# Patient Record
Sex: Male | Born: 1945 | Race: White | Hispanic: No | Marital: Married | Smoking: Former smoker
Health system: Southern US, Community
[De-identification: ages and names within clinical notes are randomized; demographics above are authoritative.]

## PROBLEM LIST (undated history)

## (undated) DIAGNOSIS — I509 Heart failure, unspecified: Secondary | ICD-10-CM

## (undated) DIAGNOSIS — M1990 Unspecified osteoarthritis, unspecified site: Secondary | ICD-10-CM

## (undated) DIAGNOSIS — R011 Cardiac murmur, unspecified: Secondary | ICD-10-CM

## (undated) DIAGNOSIS — J449 Chronic obstructive pulmonary disease, unspecified: Secondary | ICD-10-CM

## (undated) DIAGNOSIS — S82409A Unspecified fracture of shaft of unspecified fibula, initial encounter for closed fracture: Secondary | ICD-10-CM

## (undated) DIAGNOSIS — H269 Unspecified cataract: Secondary | ICD-10-CM

## (undated) DIAGNOSIS — I10 Essential (primary) hypertension: Secondary | ICD-10-CM

## (undated) DIAGNOSIS — R0902 Hypoxemia: Secondary | ICD-10-CM

## (undated) DIAGNOSIS — G4730 Sleep apnea, unspecified: Secondary | ICD-10-CM

## (undated) HISTORY — DX: Unspecified osteoarthritis, unspecified site: M19.90

## (undated) HISTORY — PX: EYE SURGERY: SHX253

## (undated) HISTORY — PX: OTHER SURGICAL HISTORY: SHX169

## (undated) HISTORY — DX: Essential (primary) hypertension: I10

## (undated) HISTORY — PX: BACK SURGERY: SHX140

## (undated) HISTORY — DX: Cardiac murmur, unspecified: R01.1

## (undated) HISTORY — DX: Heart failure, unspecified: I50.9

## (undated) HISTORY — DX: Unspecified fracture of shaft of unspecified fibula, initial encounter for closed fracture: S82.409A

## (undated) HISTORY — DX: Unspecified cataract: H26.9

## (undated) HISTORY — DX: Sleep apnea, unspecified: G47.30

## (undated) HISTORY — DX: Hypoxemia: R09.02

## (undated) HISTORY — DX: Chronic obstructive pulmonary disease, unspecified: J44.9

---

## 2005-12-24 ENCOUNTER — Ambulatory Visit (HOSPITAL_COMMUNITY): Admission: RE | Admit: 2005-12-24 | Discharge: 2005-12-25 | Payer: Self-pay | Admitting: Neurosurgery

## 2007-05-26 IMAGING — CR DG CHEST 2V
2 series · 2 of 2 positions shown · non-contrast
Comparison: None.

CLINICAL DATA: Pre admit for HNP.  
 CHEST ? 2 VIEW:

[view not recorded (1 of 2)]
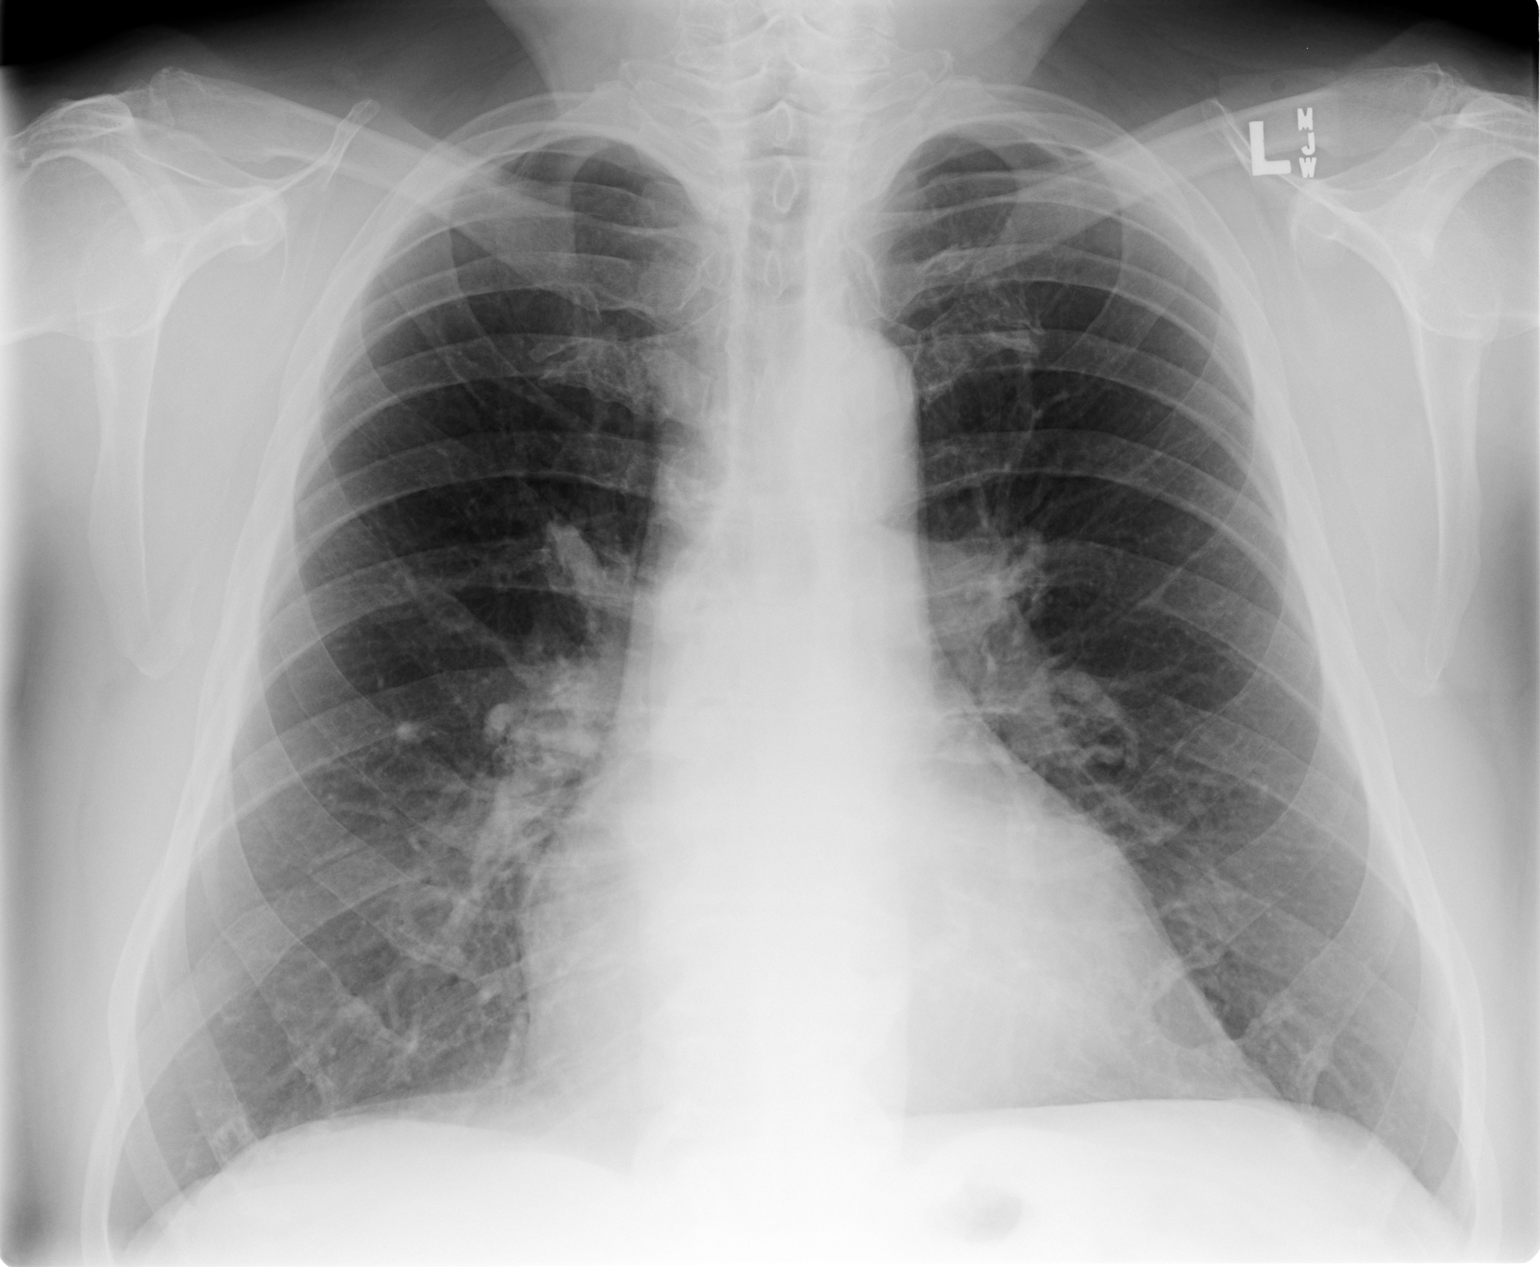

[view not recorded (2 of 2)]
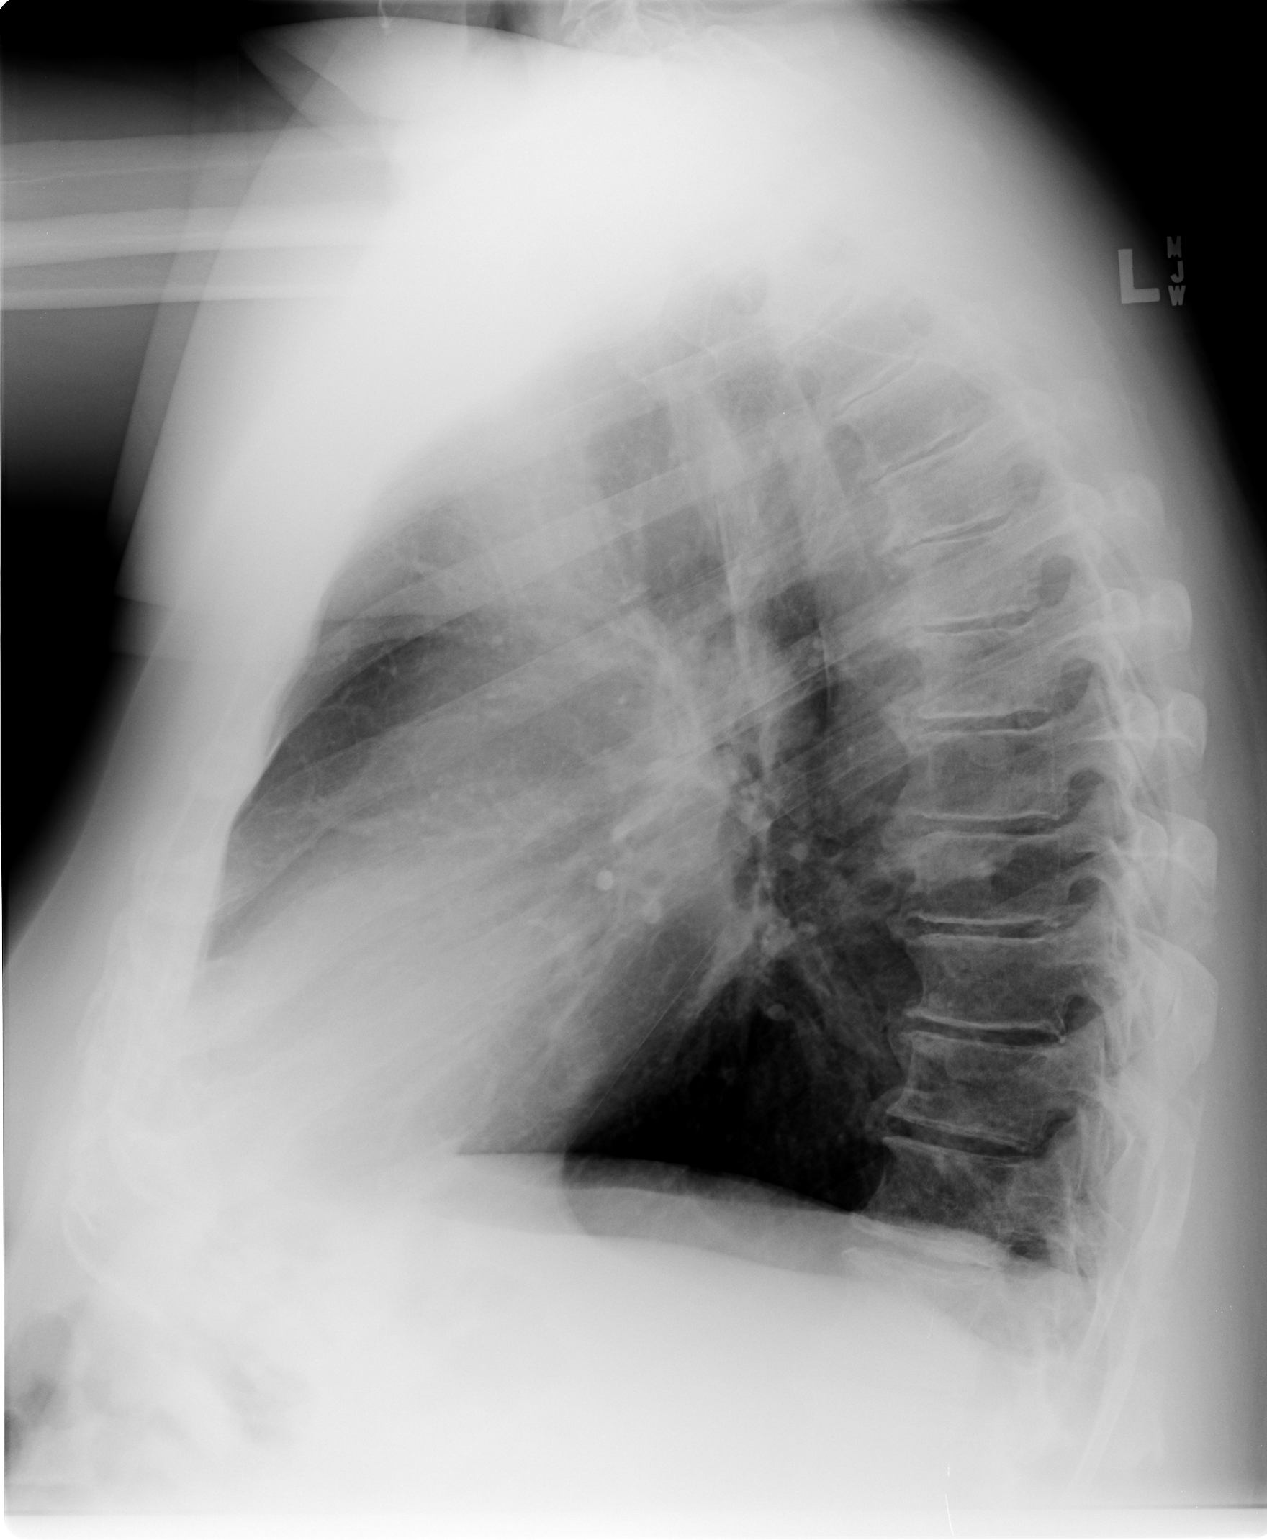

[2 of 2 positions shown; findings below may reference images not displayed]

FINDINGS: Heart size is upper normal.  No congestive heart failure or active disease.  There is either a calcified granuloma, or vessel on end simulating a granuloma, in the right midlung in the PA film.  
 The lungs are somewhat hyperaerated with peribronchial thickening.  Degenerative changes of the thoracic spine.
IMPRESSION: Chronic changes as described above ? no active disease.

## 2007-05-28 IMAGING — CR DG LUMBAR SPINE 1V
1 series · 1 of 1 positions shown · non-contrast
Comparison: None available.

CLINICAL DATA: L3-4 diskectomy.  
 LUMBAR SPINE ? 1 VIEW:

[view not recorded]
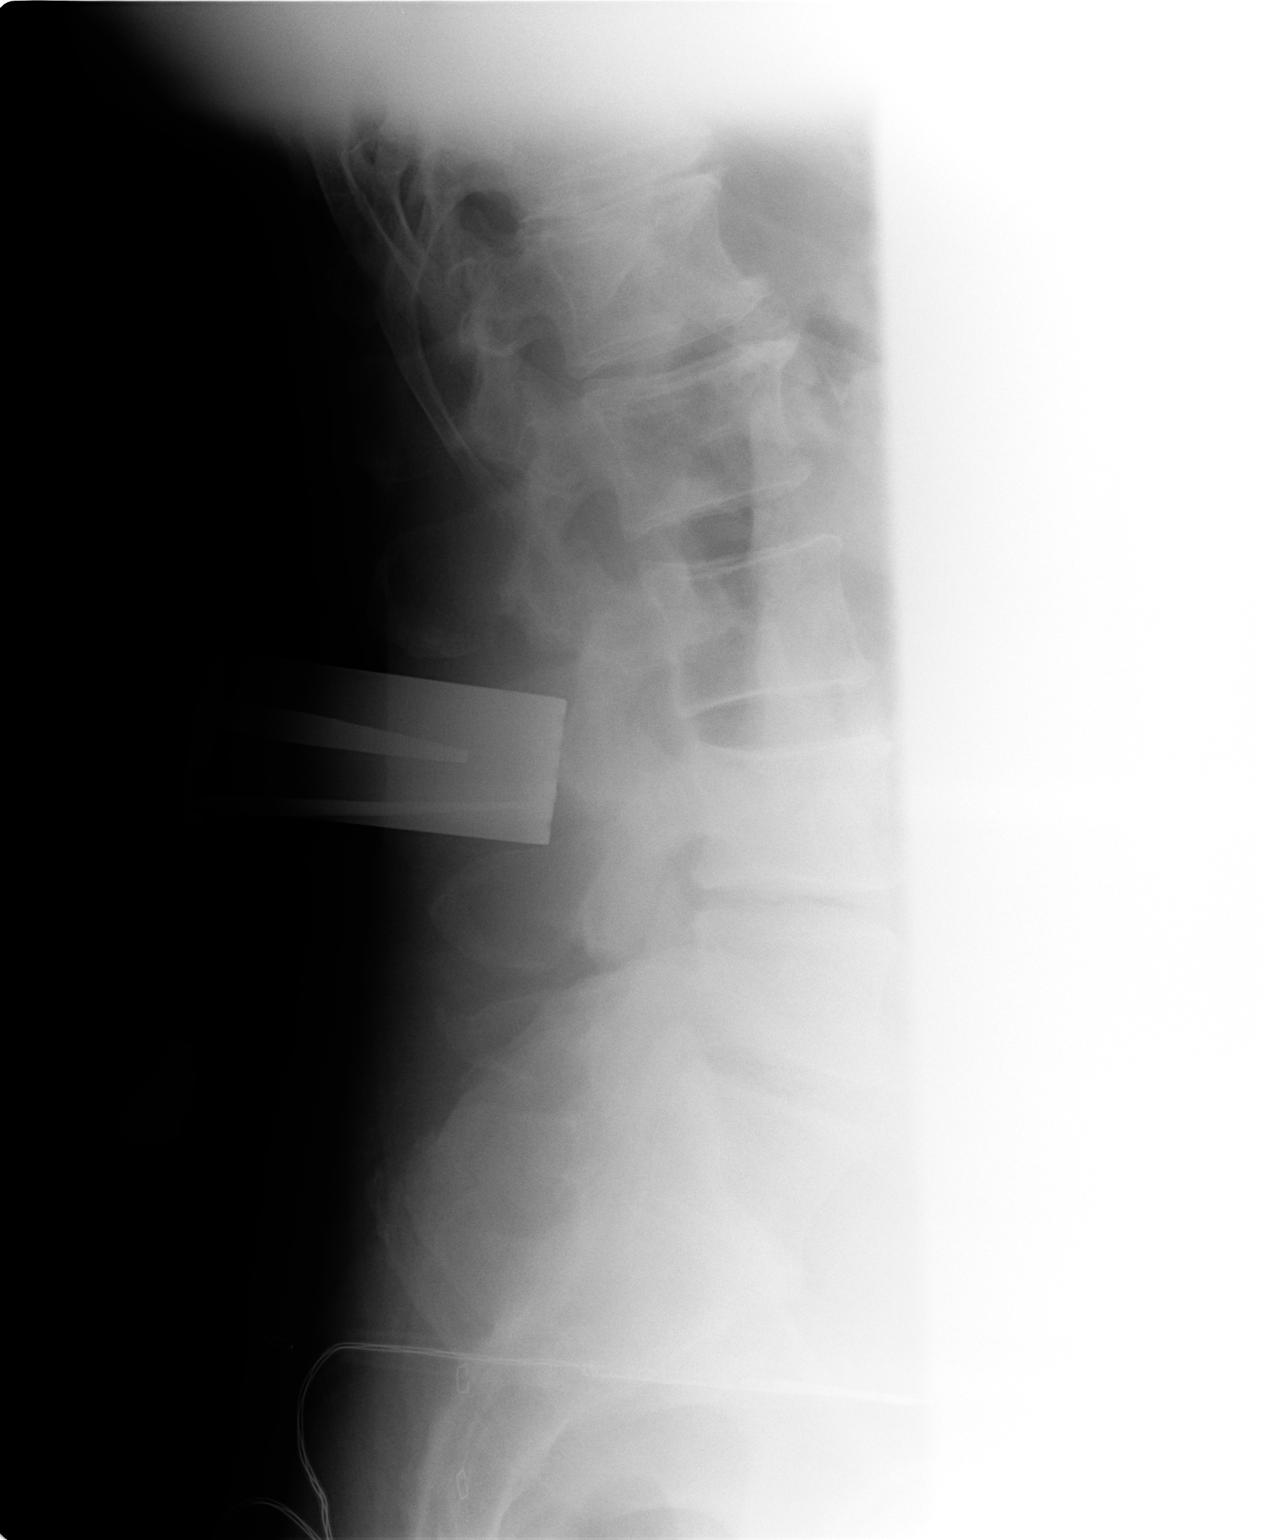

[1 of 1 positions shown; findings below may reference images not displayed]

FINDINGS: Single image labeled number 1 at 8278 hours.  There is diminutive intravertebral disc space.  This suggest a transitional lumbosacral vertebral body.  Presuming this transitional vertebral body is labeled L5, a surgical device projects posterior to L3.  Vertebral bodies are of normal height.
IMPRESSION: There is likely transitional anatomy.  The surgical device projects posterior to the L3 (presuming the transitional vertebral body is labeled L5).  Findings were called and discussed with Dr. Banyana in the operating room on the morning of 12/24/05 at approximately 5522 hours.

## 2015-01-10 NOTE — Patient Outreach (Signed)
[facility] Customer service manager [facility]) Care Management  01/10/2015  Christian Grimes May 06, 1946 [id number]   Referral from High Risk List, assigned Christian Pavy, RN to outreach.  [name]. [name] [name] Care Management [facility] CM Assistant Phone: [phone] Fax: [phone]

## 2015-01-16 ENCOUNTER — Other Ambulatory Visit: Payer: Self-pay

## 2015-01-16 NOTE — Patient Outreach (Signed)
High Risk Screening call: Reviewed noted from  MD office. Placed call to patient.  No answer. Unidentified machine. No message left.   PLAN:  Will continue to outreach patient and offer services.  Rowe PavyAmanda Idelle Reimann, RN, BSN, CEN [facility] [employer] [employer] Coordinator [phone]

## 2015-01-21 ENCOUNTER — Other Ambulatory Visit: Payer: Self-pay

## 2015-01-21 NOTE — Patient Outreach (Signed)
2nd outreach attempt for risk screening:  Placed call to patient and patient identified himself. I explained purpose of call and described program. [facility]  Offer initial home visit on July 18th and patient has accepted. Confirmed address.  Rowe PavyAmanda Kristofor Michalowski, RN, BSN, CEN [facility] Care Coordinator [phone]

## 2015-02-03 ENCOUNTER — Other Ambulatory Visit: Payer: Self-pay

## 2015-02-03 VITALS — BP 110/60 | HR 83 | Resp 18 | Ht 70.0 in | Wt 274.0 lb

## 2015-02-03 DIAGNOSIS — I509 Heart failure, unspecified: Secondary | ICD-10-CM

## 2015-02-03 NOTE — Patient Outreach (Signed)
[facility]) Care Management   02/03/2015  Christian Grimes [birth year] [id number]  Christian Grimes is an 69 y.o. male Initial home visit at 10 am. Called patient 5 minutes prior to arrival to notify patient I was close to his home per his request to be able to put up the dogs. Subjective:  Arrived to find patient sitting in den area.  Wearing oxygen. Patient reports that he had to go to the emergency department 4 days ago because of shortness of breath. States that his air conditioner was not working. Air conditioner was repaired. Patient reports that he is doing the best that he can. States that his wife died about 8-9 months ago. Patient has hired someone to come clean his house 2 times per month. Hired someone to do the yard work.  Reports that he is still able to drive.  Patient reports that he uses his scooter when her goes to store. Patient reports that he uses [employer] for frozen meals. States that he eats cereal or oatmeal for breakfast and then eats a frozen meal for dinner. Patient reports that he is on the waiting list of meals on wheels.   Patient reports that he has a home [facility] that he uses for daily weights and blood pressures. Patient uses [facility] for nebulizers.  Uses [employer] For oxygen use.  They service and fill his portable oxygen canisters.   Patient reports that his COPD and Heart Failure is bad.  He has recently had a fall at home in the carport.   Objective:  Filed Vitals:   02/03/15 1033  BP: 110/60  Pulse: 83  Resp: 18  Height: 1.778 m (5\' 10" )  Weight: 274 lb (124.286 kg)  SpO2: 98%    Review of Systems  Respiratory: Positive for cough and shortness of breath.   Cardiovascular: Positive for leg swelling.  Skin: Positive for itching.       Reports his legs swell and then itch. He scratches and then has wounds on his legs.    Physical Exam  Vitals reviewed. Constitutional: He is oriented to person,  place, and time. He appears well-developed and well-nourished.  Cardiovascular: Normal rate.   Irregular rhythm  Respiratory: Effort normal and breath sounds normal.  Respirations even and unlabored when at rest. Appears short of breath with any activity like walking in the home.  GI: Soft. Bowel sounds are normal.  Musculoskeletal: He exhibits edema.  Right legs 4 plus edema up to the right knee, Left leg 1 plus edema at the knee. Has compression wrap to the left leg. Unable to visualize any edema to the lower leg due to compression wrap.  Neurological: He is alert and oriented to person, place, and time.  Skin: Skin is warm and dry.  Right lower leg with scaly appearance.  Right lateral aspect of the lower leg with 2 open areas noted. Dried blood on lower leg.  One open area to the back of the right leg.  Right foot with skin intact.   Unable to visualize the left leg because of compression wrap. Knee and thigh without areas of breakdown.  Psychiatric: He has a normal mood and affect. His behavior is normal. Judgment and thought content normal.    Current Medications:   Current Outpatient Prescriptions  Medication Sig Dispense Refill  . acetaminophen (TYLENOL) 500 MG tablet Take 500 mg by mouth every 6 (six) hours as needed.    .Marland Kitchen  albuterol (ACCUNEB) 1.25 MG/3ML nebulizer solution Take 1 ampule by nebulization every 6 (six) hours as needed.    Marland Kitchen albuterol-ipratropium (COMBIVENT) 18-103 MCG/ACT inhaler Inhale 2 puffs into the lungs every 4 (four) hours. Patient has been taking this need as needed only.    Marland Kitchen atorvastatin (LIPITOR) 20 MG tablet Take 20 mg by mouth daily.    . carvedilol (COREG) 6.25 MG tablet Take 6.25 mg by mouth 2 (two) times daily with a meal.    . furosemide (LASIX) 40 MG tablet Take 160 mg by mouth 2 (two) times daily. Patient take 4 tablets 40 mg each    . ipratropium (ATROVENT) 0.02 % nebulizer solution Take 0.5 mg by nebulization 4 (four) times daily. Patient reports  that he takes his med as needed only    . lisinopril (PRINIVIL,ZESTRIL) 5 MG tablet Take 5 mg by mouth daily.    Marland Kitchen loperamide (IMODIUM) 2 MG capsule Take 1 capsule by mouth as needed.    . metolazone (ZAROXOLYN) 5 MG tablet Take 5 mg by mouth as needed. Patient reports that he takes this med once or twice a week depending on weight gain.    . Tiotropium Bromide-Olodaterol 2.5-2.5 MCG/ACT AERS Inhale 2 puffs into the lungs 1 day or 1 dose. Patient was taking as needed only    . warfarin (COUMADIN) 5 MG tablet Take 5 mg by mouth daily. Patient has his own INR machine and checks his own labs.     No current facility-administered medications for this visit.    Functional Status:   In your present state of health, do you have any difficulty performing the following activities: 02/03/2015  Hearing? N  Vision? N  Difficulty concentrating or making decisions? N  Walking or climbing stairs? Y  Dressing or bathing? N  Doing errands, shopping? N  Preparing Food and eating ? Y  Using the Toilet? N  In the past six months, have you accidently leaked urine? Y  Do you have problems with loss of bowel control? N  Managing your Medications? N  Managing your Finances? N  Housekeeping or managing your Housekeeping? Y    Fall/Depression Screening:    PHQ 2/9 Scores 02/03/2015  PHQ - 2 Score 0   Fall Risk  02/03/2015  Falls in the past year? Yes  Number falls in past yr: 2 or more  Injury with Fall? No  Risk Factor Category  High Fall Risk  Risk for fall due to : History of fall(s)  Follow up Falls prevention discussed   Assessment:  (1) New patient. Reviewed [facility] program. Provided and reviewed welcome packet. Provided contact information and [facility] magnet. (2) increased risk for falls. (3) no current advance directives/ observe advanced directives wife was list as health care power of attorney. ( wife is deceased) (4)lack of understanding about medications. (5) lack of understanding about management  of heart failure and COPD. (6) War Psychologist, counselling.  Could use assistance at home with ADLS. (7) currently obtaining frozen meals from [employer]. Pending meals on wheels. Needs follow up.   Plan: (1) Consent obtained and scanned into chart. (2)Reviewed safety precautions in home and bathroom.   (3) Referral to [facility] social worker to assist with completion of new advanced directives. Advanced Directive packet provided to patient during home visit. (4) Spoke with [facility] pharmacist during home visit. Will send referral to [facility] pharmacist to assist with medication management. Placed call to [facility] and spoke with Victorino Dike (Dr. Arville Care nurse) informed  of medication questions. Patient has office visit planned for tomorrow (02/04/2015). (5) see care plan. Provided education and guidance about COPD and Heart Failure. (6) referral to [facility] social worker to assist with VA information. (7) will ask [facility] social worker to assist with follow up with meals on wheels.   [facility] CM Care Plan Problem One        Patient Outreach from 02/03/2015 in [facility] [employer]   Care Plan Problem One  Knowledge deficit related to copd as evidence by lack of understanding about  daily maintenance .   Care Plan for Problem One  Active   [facility] Long Term Goal (31-90 days)  Patient will be able to report no admissions related to COPD in the next 60 days.    [facility] Long Term Goal Start Date  02/03/15   Interventions for Problem One Long Term Goal  Reviewed and disucssed COPD zones. Reviewed the importance of taking his maintenance meds as prescribed.  Call [facility] pharmacist to disucss inhalers and how they should be used. referral to [facility] pharmacy to review meds and assist with patient education. Disucssed importance of calling MD for follow up appointment.   [facility] CM Short Term Goal #1 (0-30 days)  Patient will be able to verbalize understanding of CPOD zones and action plan in the next 30 days.   THN CM Short Term Goal #1 Start Date   02/03/15   Interventions for Short Term Goal #1  Reviewed COPD zones. Provided [facility] calendar with COPD zones included for patient to review. Provided EMMI education on COPD during home visit for pati    [facility] CM Care Plan Problem Two        Patient Outreach from 02/03/2015 in [facility]   Care Plan Problem Two  Knowledge deficit related to Congestive Heart Failure as eveidence by lack of daily weights.    Care Plan for Problem Two  Active   Interventions for Problem Two Long Term Goal   Reviewed heart failure zones with patient. Reviewed importance of daily weights. Discussed importance of weights first things in the morning. Discussed importance of recording weights. Provided [facility] calendar to record weights. Todays weight recorded  in calendar.   [facility]N Long Term Goal (31-90) days  Patient will be able to verbalize no admissions related to heart failure in the next 60 days.    [facility]N Long Term Goal Start Date  02/03/15   [facility] CM Short Term Goal #1 (0-30 days)  Patient will record daily weights for the next 30 days.   [facility] CM Short Term Goal #1 Start Date  02/03/15   Interventions for Short Term Goal #2   Reviewed importance of calling MD with weight gain of 2-3 pounds over night or 5 pounds in a week. Reviewed importance of taking all meds as prescribed.     Collaborative goal setting and care planning. Priority goals is to improved understanding of daily management of Heart failure and COPD.  Next home visit planned for August 15th.   Will send this report to MD.  Verbally report provided to MD office today. Referral to [facility] pharmacy and social worker.  [facility] welcome packet, magnet, calendar, EMMI education and contact information provided in written form to patient during home visit.  Rowe Pavy, RN, BSN, CEN [facility] Coordinator [phone]

## 2015-02-04 NOTE — Patient Outreach (Addendum)
[facility]) Care Management  02/04/2015  Christian Grimes 09/06/1945 [id number]   Request from Rowe PavyAmanda Cook, RN to assign SW and Pharmacy, Danford BadJoanna Saporito, LCSW and Steve Rattlerawn Pettus, PharmD assigned.  Christian Grimes, Christian Grimes [facility] CM Assistant Phone: [phone] Fax: [phone]

## 2015-02-13 ENCOUNTER — Other Ambulatory Visit: Payer: Self-pay | Admitting: *Deleted

## 2015-02-13 NOTE — Patient Outreach (Signed)
[facility]) Care Management  02/13/2015  Christian Grimes Nov 25, 1945 [id number]   CSW was able to make initial contact with patient today to perform phone assessment, as well as assess and assist with social work needs and services.  CSW introduced self, explained role and types of services provided through [employer] Care Management [facility] Care Management).  CSW further explained to patient that CSW works with patient's RNCM, Rowe Pavy, also with [facility] Care Management.  CSW explained the reason for the call, indicating that Mrs. [name] felt that patient may benefit from social work services to assist with completing his Designer, industrial/product (Living Will and [employer] of [place] documents).  Patient's wife died in 09-May-2014 and she was listed as patient's beneficiary and healthcare power of attorney.  The documents will now need to be updated and a new beneficiary and healthcare agent will need to be identified.  In addition, Mrs. [name] requested that CSW assist patient with obtaining all eligible benefits through [employer].  Patient is currently on the waiting list for Mobile Meals through [employer] [facility], so CSW will assist patient with periodically checking the status of his application for services. Patient was able to provide CSW with two HIPAA compliant identifiers, which included his name and date of birth.  Patient also gave CSW verbal consent to speak with [employer] and [employer], on his behalf.  CSW agreed to meet with patient for an initial home visit to assist with all of the above named services, a swell as assess for any additional social work needs.  Initial home visit with patient has been scheduled for Thursday, August 11th at 10:30am.  [name], BSW, MSW, LCSW [facility] [address] [place], [address] [place], [place]  [address] [email or web address] [phone]

## 2015-02-14 ENCOUNTER — Other Ambulatory Visit: Payer: Self-pay

## 2015-02-14 NOTE — Patient Outreach (Signed)
Called Mr. Wyka and set up an initial home visit for Monday, February 17, 2015 at 2 pm.    Steve Rattler, PharmD, [facility] [employer] [phone]

## 2015-02-14 NOTE — Patient Outreach (Signed)
Care coordination: Patient called on 02/13/2015 and left a voicemail requesting a call back.  Patient called back today (02/14/2015)  Reports that he was calling to check and see how his referrals were coming.  Patient reports that he spoke with pharmacy and social worker since leaving his voicemail yesterday.  I confirmed with [facility] pharmacy that patient has an appointment scheduled for a home visit.  Denies any other concerns today. Reminded patient of our pending appointment for August 15th at 10:00   Rowe Pavy, Charity fundraiser, Scientist, research (physical sciences), [facility] [employer] [phone]

## 2015-02-17 ENCOUNTER — Other Ambulatory Visit: Payer: Self-pay

## 2015-02-17 NOTE — Patient Outreach (Signed)
[facility]) Care Management  [facility] CM Pharmacy   02/17/2015  FREMAN LAPAGE 01-02-1946 [id number]  Subjective: Mr. Maish is a 69 year old male who I am following for assistance with the education of medications.  He has COPD and he has been prescribed several inhalers (maintenance and rescue) for management of COPD.  He reports that since his last visit by [facility] Care Manager, Rowe Pavy, RN, he has started using his maintenance inhaler (tiotropium/olodaterol) daily as scheduled.  He has noticed that his use of his rescue medications (albuterol nebulizer, ipratropium nebulizer, albuterol HFA, and Combivent) has decreased since he started using his maintenance inhaler as prescribed.    Mr. Hartis had a recently hospitalization and was discharged on Monday, August 25th.  He was discharged with two new prescriptions for cefdinir (7 day course, to end on Monday, August 1st) and tramadol (currently taking 50mg  daily).    When reviewing Mr. Jaskiewicz's medication list, Mr. Montanye reported that his most recent INR was 8 when he checked it on Saturday, July 30th.  He states he sent the results into the company but has not heard back from his provider about what to do.  He has continued to take his daily warfarin dose of 5mg  daily.  He reports he takes his warfarin in the mornings and he has already taken his dose for today.  Mr. Cellucci has a home INR machine so I asked him to re-check INR during my visit.  INR registered as greater than 8.  I reviewed signs and symptoms of bleeding with him (ie nose bleeds, blood in urine, black tarry stools, etc).  He denies any bleeding; however, I noticed that he had some dried blood on his legs from where he picked at some dead skin.  Upon examination of both legs discovered that the back of his right leg was bleeding from another spot he had picked at.  Applied gauze and wrapped site with pressure to stop bleeding.     I called his cardiologist, Dr. Dulce Sellar, who  manages his warfarin.  Dr. Hulen Shouts office informed him to hold warfarin starting 02/18/15, eat green leafy vegetables today, check INR daily and call office directly with results.  They informed him to not resume warfarin until they tell him to.   Lastly, he expresses interest in having his prescriptions filled at the [place].  Mr. Zentner also stated he has a visit with his primary care provider on Tuesday, February 18, 2015 at 11 am.   Objective:   Home monitoring of INR result: >8  Current Medications: Current Outpatient Prescriptions  Medication Sig Dispense Refill  . acetaminophen (TYLENOL) 500 MG tablet Take 500 mg by mouth every 6 (six) hours as needed.    Marland Kitchen albuterol (ACCUNEB) 1.25 MG/3ML nebulizer solution Take 1 ampule by nebulization every 6 (six) hours as needed.    . ALBUTEROL SULFATE HFA IN Inhale 2 puffs into the lungs as needed.    Marland Kitchen albuterol-ipratropium (COMBIVENT) 18-103 MCG/ACT inhaler Inhale 2 puffs into the lungs every 4 (four) hours. Patient has been taking this need as needed only.    Marland Kitchen atorvastatin (LIPITOR) 20 MG tablet Take 20 mg by mouth daily.    . carvedilol (COREG) 6.25 MG tablet Take 6.25 mg by mouth 2 (two) times daily with a meal.    . cefdinir (OMNICEF) 300 MG capsule Take 300 mg by mouth 2 (two) times daily.    . furosemide (LASIX) 40 MG tablet Take 160 mg  by mouth 2 (two) times daily. Patient take 4 tablets 40 mg each    . ipratropium (ATROVENT) 0.02 % nebulizer solution Take 0.5 mg by nebulization 4 (four) times daily. Patient reports that he takes his med as needed only    . lisinopril (PRINIVIL,ZESTRIL) 5 MG tablet Take 5 mg by mouth daily.    Marland Kitchen loperamide (IMODIUM) 2 MG capsule Take 1 capsule by mouth as needed.    . metolazone (ZAROXOLYN) 5 MG tablet Take 5 mg by mouth as needed. Patient reports that he takes this med once or twice a week depending on weight gain.    . Tiotropium Bromide-Olodaterol 2.5-2.5 MCG/ACT AERS Inhale 2 puffs into the lungs 1 day or 1  dose. Patient was taking as needed only    . traMADol (ULTRAM) 50 MG tablet Take 50 mg by mouth 3 (three) times daily as needed.    . warfarin (COUMADIN) 5 MG tablet Take 5 mg by mouth daily. Patient has his own INR machine and checks his own labs.     No current facility-administered medications for this visit.    Functional Status: In your present state of health, do you have any difficulty performing the following activities: 02/03/2015  Hearing? N  Vision? N  Difficulty concentrating or making decisions? N  Walking or climbing stairs? Y  Dressing or bathing? N  Doing errands, shopping? N  Preparing Food and eating ? Y  Using the Toilet? N  In the past six months, have you accidently leaked urine? Y  Do you have problems with loss of bowel control? N  Managing your Medications? N  Managing your Finances? N  Housekeeping or managing your Housekeeping? Y    Fall/Depression Screening: PHQ 2/9 Scores 02/03/2015  PHQ - 2 Score 0    Assessment:  1.  Anticoagulation: INR is supratherapeutic today, likely attributed to new prescriptions of cefdinir and tramadol.  He had one more dose of cefdinir in the 7 day course.  He is currently taking tramadol daily; however, he reports a pain score of 0 today during my visit.  2.  COPD: Per he report, he has started using his maintenance inhaler daily as scheduled which has resulted in better control of COPD as evident by less frequent use of rescue medications (currently using 1 to 3 times weekly).    Plan:  1. Per Dr. Hulen Shouts office: stop warfarin, eat green leafy vegetables, monitor INR daily, and call cardiologist office with results daily.  He should not resume warfarin until Dr. Hulen Shouts office informs him to resume.   2. Counseled him on signs and symptoms of bleeding, and informed him to present to the ER if bleeding occurs, especially if the bleeding worsens on his leg.  I also educated him on the reason he is being anticoagulated with  warfarin (AFib), and reviewed his INR goal of 2 to 3.   3. Encouraged him to only take tramadol if needed for pain.  Stressed the importance of being consistent with taking it.  He either needs to take it daily or not at all. This is due to the drug interaction and causing his INR to increase when he takes it.  4. He has PCP appointment on Tuesday, August 2nd.  Will call PCP to inform them of supratherapeutic INR and plan per cardiology office.  Will ask if PCP office can change gauze bandage on he's leg during visit.  Could consider a venous lab draw to assess INR level.  5.  In the future, cardiologist requested him to only monitor INR Monday through Thursday while cardiologist is on call to adjust warfarin regimen.  6. Will call him tomorrow, August 2nd to follow up on INR.  7. Encouraged him to continue to use medications as prescribed.  Reinforced importance of using maintenance inhaler daily even when he does not feel that he needs it.    [facility] CM Care Plan Problem One        Patient Outreach from 02/17/2015 in [employer]   Care Plan Problem One  Knowledge of [facility] benefits for prescription medications   Care Plan for Problem One  Active   [facility]N Long Term Goal (31-90 days)  Patient will find out percent service connection from the [place] in the next 90 days evidence by patient report.    [facility]N Long Term Goal Start Date  02/17/15   Interventions for Problem One Long Term Goal  Will follow up with social work to determine patient's eligibility for care at the [place]. Discussed [facility] locations near patient and determined [facility] is probably the closest [place] to the patient.    [facility] CM Short Term Goal #1 (0-30 days)  Patient will call the [facility] to try to get an appointment set up with a primary care provider at the [facility] in the next 30 days, evidence by patient report.     THN CM Short Term Goal #1 Start Date  02/17/15   Interventions for Short Term Goal #1  Provided patient with the phone number for  the [facility].     [facility] CM Care Plan Problem Two        Patient Outreach from 02/17/2015 in [facility] Plan Problem Two  Use of maintenance inhalers for COPD.    Care Plan for Problem Two  Active   Interventions for Problem Two Long Term Goal   Reviewed importance of taking maintenance inhalers daily whether he feels good or not.    THN Long Term Goal (31-90) days  Patient will continue to use maintenance inhalers scheduled daily for the next 90 days per patient report.    THN Long Term Goal Start Date  02/17/15   THN CM Short Term Goal #1 (0-30 days)  Reduce the use of rescue medications in the next 30 days per patient report.    [facility]N CM Short Term Goal #1 Start Date  02/17/15   Interventions for Short Term Goal #2   Reviewed with patient to only use rescue medications as needed. Patient has both nebulizer, respimat and HFA formulations of rescue medications. Informed patient to use one type at a time and to wait for effective for 5 to 10 minutes before using another type.      Steve Rattler, PharmD, [employer] [facility] [employer] consultant [phone]

## 2015-02-18 ENCOUNTER — Other Ambulatory Visit: Payer: Self-pay

## 2015-02-18 NOTE — Patient Outreach (Signed)
I called [facility] to discuss Mr. Bartosiewicz with Dr. Arville Care nurse, Victorino Dike.  I informed Victorino Dike about my encounter yesterday.  I informed her of the INR results from yesterday (>8) and the discussion with Dr. Hulen Shouts office.  I also told her about his leg wound and the bleeding that was occurring when I arrived.  I stated we were able to stop his bleeding and wrapped his leg to prevent further bleeding from the wound.  I asked if they would examine the wound and check an INR if they felt necessary.  She appreciated the call and would definitely address the issues during his visit today.   Steve Rattler, PharmD, [employer] [facility] Environmental consultant [phone]

## 2015-02-18 NOTE — Patient Outreach (Signed)
I received a call from Christian Grimes.  He stated he checked his INR and it was 8.0 today on his home monitor.  He saw Dr. Elton Sin and had a venous draw.  He stated the INR was "7 something".  She stated Dr. Elton Sin instructed him to continue to hold the warfarin and to eat some green vegetables. He stated he did do this.  He is going to check his INR again tomorrow.  Dr. Elton Sin also examined his wound and changed the dressing.  Christian Grimes is following up with Dr. Elton Sin tomorrow to have his wound looked at again.  I told Mr. Chrisley we would call him tomorrow to follow up on his INR and he stated that was fine.   Steve Rattler, PharmD, [employer] [facility] Environmental consultant [phone]

## 2015-02-19 ENCOUNTER — Other Ambulatory Visit: Payer: Self-pay | Admitting: Pharmacist

## 2015-02-19 NOTE — Patient Outreach (Signed)
I called Christian Grimes to follow up on his INR today.  He stated he checked his INR this morning at home and it was 3.7.  He also had a follow up with Christian Grimes to have his wound looked at again, and they did a venous draw.  He stated the INR with the venous draw was 3.5.  He said he has eaten a lot of green leafy vegetables the past two days (Brussel sprouts on 02/17/15 and turnip greens on 02/18/15).  He reports he will go back to his normal diet today.  He stated he was instructed to continue to hold his warfarin dose today, and to recheck his INR at home tomorrow.  He reports that his wound was no longer bleeding when the bandage was changed at Christian Grimes office today.  He denies any other signs or symptoms of bleeding.  I told Christian Grimes we would call him tomorrow to follow up on his INR and he stated that was fine.   Lilla Shook, Pharm.D. Pharmacy Resident [employer] [employer]

## 2015-02-21 ENCOUNTER — Other Ambulatory Visit: Payer: Self-pay | Admitting: Pharmacist

## 2015-02-21 NOTE — Patient Outreach (Signed)
[facility]) Care Management  02/21/2015  GRECO GASTELUM 05-01-1946 [id number]  I called Mr. Cullen to follow up on his INR.  He reports that his INR today per his home machine was 2.7.  Mr. Rossa denies any signs or symptoms of bleeding.  He was instructed by Dr. Hulen Shouts office to restart warfarin today at his previous dose of  daily.  He reports he has completed his 7-day course of cefdinir (last dose on 02/17/15) and he has not taken any tramadol since 02/17/15.  He denies pain.  He reports Dr. Hulen Shouts office wants him to recheck his INR again in one week.  I reminded him to check INR between Monday and Thursday per cardiology office request so that cardiologist on call will have time to review the results and adjust warfarin regimen if needed.  I re-reviewed his INR goal of 2 to 3.  I will call him next week on Thursday, August 11th to follow up on his INR.     Lilla Shook, Pharm.D. Pharmacy Resident [facility] [employer]

## 2015-02-27 ENCOUNTER — Encounter: Payer: Self-pay | Admitting: *Deleted

## 2015-02-27 ENCOUNTER — Other Ambulatory Visit: Payer: Self-pay | Admitting: Pharmacist

## 2015-02-27 ENCOUNTER — Other Ambulatory Visit: Payer: Self-pay | Admitting: *Deleted

## 2015-02-27 NOTE — Patient Outreach (Signed)
[facility]) Care Grimes  [facility] Social Work  02/27/2015  Christian Grimes 1946/03/18 [id number]   Current Medications:  Current Outpatient Prescriptions  Medication Sig Dispense Refill  . acetaminophen (TYLENOL) 500 MG tablet Take 500 mg by mouth every 6 (six) hours as needed.    Marland Kitchen albuterol (ACCUNEB) 1.25 MG/3ML nebulizer solution Take 1 ampule by nebulization every 6 (six) hours as needed.    . ALBUTEROL SULFATE HFA IN Inhale 2 puffs into the lungs as needed.    Marland Kitchen albuterol-ipratropium (COMBIVENT) 18-103 MCG/ACT inhaler Inhale 2 puffs into the lungs every 4 (four) hours. Patient has been taking this need as needed only.    Marland Kitchen atorvastatin (LIPITOR) 20 MG tablet Take 20 mg by mouth daily.    . carvedilol (COREG) 6.25 MG tablet Take 6.25 mg by mouth 2 (two) times daily with a meal.    . furosemide (LASIX) 40 MG tablet Take 160 mg by mouth 2 (two) times daily. Patient take 4 tablets 40 mg each    . ipratropium (ATROVENT) 0.02 % nebulizer solution Take 0.5 mg by nebulization 4 (four) times daily. Patient reports that he takes his med as needed only    . lisinopril (PRINIVIL,ZESTRIL) 5 MG tablet Take 5 mg by mouth daily.    Marland Kitchen loperamide (IMODIUM) 2 MG capsule Take 1 capsule by mouth as needed.    . metolazone (ZAROXOLYN) 5 MG tablet Take 5 mg by mouth as needed. Patient reports that he takes this med once or twice a week depending on weight gain.    . Tiotropium Bromide-Olodaterol 2.5-2.5 MCG/ACT AERS Inhale 2 puffs into the lungs 1 day or 1 dose. Patient was taking as needed only    . traMADol (ULTRAM) 50 MG tablet Take 50 mg by mouth 3 (three) times daily as needed.    . warfarin (COUMADIN) 5 MG tablet Take 5 mg by mouth daily. Patient has his own INR machine and checks his own labs.     No current facility-administered medications for this visit.    Functional Status:  In your present state of health, do you have any difficulty performing the following activities:  02/27/2015 02/03/2015  Hearing? N N  Vision? N N  Difficulty concentrating or making decisions? N N  Walking or climbing stairs? Y Y  Dressing or bathing? N N  Doing errands, shopping? Y N  Preparing Food and eating ? N Y  Using the Toilet? N N  In the past six months, have you accidently leaked urine? N Y  Do you have problems with loss of bowel control? N N  Managing your Medications? N N  Managing your Finances? N N  Housekeeping or managing your Housekeeping? [name]    Fall/Depression Screening:  PHQ 2/9 Scores 02/27/2015 02/03/2015  PHQ - 2 Score 0 0  Exception Documentation Patient refusal -    Assessment:   CSW was able to meet with patient today to perform the initial home visit.  Patient was extremely pleasant and cooperative throughout the visit.  Patient is aware that CSW works with Christian Grimes, patient's RNCM with [facility] Grimes.  CSW first inquired about patient's Advanced Directives (Living Will and Christian Grimes documents) and whether or not he wished to update them since the recent death of his wife.  After thorough review of patient's documents, CSW quickly realized that there was no need for patient to update the documents, as patient had already appointed his daughter,  Christian Grimes, in the even that something were to happen to his wife.  Not to mention, patient admitted to paying a few thousand dollars to have the documents drawn up at an attorney's office and filed with the courthouse.  CSW certainly did not wish for patient to have to completely redo the documents when they are already still valid and fully operational.  Christian Grimes also account for patient's assets, property, estate, etc., which is not something CSW would be able to assist patient with completing due to legal purposes.  Patient voiced understanding, agreeing to keep the current documents  as is. Next CSW spoke with patient at length about how to obtain all eligible benefits that he may be entitled to through Christian Grimes.  CSW and patient were able to contact a representative with [facility] in [place], Christian Grimes [phone]) to request services.  CSW and patient were also able to schedule an appointment for patient to meet with Christian Grimes to complete necessary paperwork on Monday, August 15th.  Patient admitted that he would have no trouble driving himself to [facility], reporting that it is right around the corner from where he lives and he is aware of it's location.  Patient has been instructed to take his DD-214, a copy of his Medicare card and any other insurance coverage verification, be prepared to provide his mother's maiden name, proof of income for fiscal year 2015 and a copy of all non-reimbursed medical expenses paid for patient and wife, Christian Grimes in fiscal year 2015.  Patient will definitely qualify for benefits, as his annual [place] is less than $16,000.00 per year.  However, his income exceeds the [place] for fiscal year 2016; therefore, patient is not eligible to receive government assistance such as Adult Medicaid and/or Christian Grimes. Last, CSW inquired about how patient has been "getting along" without his wife.  Patient admitted, "I'm just taking one day at a time, there's not much else I can do".  CSW asked patient if he would be interested in receiving counseling and supportive services, either through [place] or via a referral from a local agency.  Patient denied.  CSW then inquired as to whether or not patient would be interested in attending a Grief and Loss Support Program offered through [employer]. Again, patient denied, indicating that he is "really doing okay".  Patient denied being able to identify any additional social work needs or services at this time.  CSW explained to patient that CSW  would no longer be making contact with patient, but that Christian Grimes would remain actively involved.  Patient voiced understanding and was agreeable to this plan.  Plan:   CSW will perform a case closure on patient, as all goals of treatment have been met from social work standpoint and no additional social work needs have been identified at this time. CSW will fax a correspondence letter to patient's Primary Care Physician, Dr. Urbano Heir to ensure that Dr. Cathi Roan is aware of CSW's involvement with patient. CSW will submit a case closure request to Lurline Del, Care Grimes Assistant with [place] [facility], in the form of an [employer].  Nat Christen, BSW, MSW, [place] [facility] [place], [place], [place] [address] [email or web address] [phone]

## 2015-02-27 NOTE — Patient Outreach (Signed)
[facility]) Care Management  02/27/2015  [name] 08-Jan-1946 [id number]  I called Christian Grimes to follow up on his INR.  Patient did not answer the telephone.  I left HIPAA compliant voice message for the patient with my call back number.  I will attempt to call [name] again tomorrow.  I have home visit scheduled on Thursday, August 18th.       Lilla Shook, Pharm.D. Pharmacy Resident [facility] [employer]

## 2015-02-28 ENCOUNTER — Encounter: Payer: Self-pay | Admitting: *Deleted

## 2015-02-28 ENCOUNTER — Other Ambulatory Visit: Payer: Self-pay | Admitting: Pharmacist

## 2015-02-28 NOTE — Patient Outreach (Signed)
[facility]) Care Management  02/28/2015  KEEVIN PANEBIANCO [birth year] [id number]  I called Mr. Brault to follow up on his INR.  Mr. Mcnay INR was previously supratherapeutic from 02/17/15 to 02/20/15 likely due to a course of antibiotic (cefdinir) and a new prescription for tramadol following a hospital discharge.  He resumed warfarin  daily on 02/21/15.  He reports that his INR yesterday (02/27/15) per his home machine was 4.7 and today it was 5.1.  He said he spoke to his cardiologist office and he was instructed to hold warfarin today (02/28/15) and tomorrow (Saturday, 03/01/15), and to recheck INR again on Sunday, August 14th.  On Sunday, they instructed him to continue to hold warfarin if his INR is greater than 3 or take  of a  tablet if his INR is <3.  He is supposed to call the cardiologist office again on Monday.  Mr. Mella reports he had a small amount of bright red blood on his toilet paper yesterday and a small amount of blood on his leg again.  He reports both have resolved.  He denies any other signs or symptoms of bleeding.  I re-reviewed signs and symptoms of bleeding with him (ie nose bleeds, blood in urine, black tarry stools, etc), and informed him to present to the ER if bleeding occurs.  I re-reviewed his INR goal of 2 to 3.  I scheduled an appointment to see Mr. Tabora along with [facility] CM RN, Rowe Pavy, on Monday, August 15th at 10:00 AM.   Lilla Shook, Pharm.D. Pharmacy Resident [facility] [employer]

## 2015-03-03 ENCOUNTER — Other Ambulatory Visit: Payer: Self-pay | Admitting: Pharmacist

## 2015-03-03 ENCOUNTER — Other Ambulatory Visit: Payer: Self-pay

## 2015-03-03 NOTE — Patient Outreach (Signed)
[facility]) Care Management   03/03/2015  Christian Grimes 1945-12-02 [id number]  Christian Grimes is an 69 y.o. male Arrived for home visit.  Patient putting dogs outside upon arrival. Joint visit with [facility] pharmacy resident Elisabeth Most.  Subjective:  COPD:    Patient reports that he is doing well. States that his breathing is much better.  Reports that he is using his inhaled medications as directed.  He reports that he understands his medications better.  Patient reports no problems with his oxygen. States that he does not leave the house when the humidity is so high.  Wound:     Reports that he is no longer going to the wound center. Reports that he has used up all his visits for the year. Reports that he changes his own dressings. States that he does not feel like the wound center has helped him. Patient reports that he does not know what kind of dressing to apply to his legs.   CHF/Afib:   Patient reports that [facility] picked up all his tele monitoring equipment including his scales. Reports that his personal scale is out of batteries. States that he bought a battery but it was the wrong size.  No weights in log since August 5th.  Weight was 269.5 pounds. Patient reports that he is out of his testing strips for his coag machine.  Last reading on 02/28/2015  5.1. Reports that his testing strips were mailed from [place] on 03/01/2015.  Patient reports that he does not know when he will get his strips. Reports leg swelling is about the same. States that he is following his low salt diet. Reports that he elevated his legs when in his recliner.  Objective:  Awake and alert, Using his rolling walker to ambulate. Able to talk in complete sentences without difficulty.  Right leg wrapped in an ace wrap. Patient removed wrap for me to visualize wound.  Right lower legs with open oozing bloody and clear drainage. Old dressing with yellow drainage on gauze. Drainage had  soaked through onto ACE wrap.  During home visit patient cleaned wound with "wound cleaner in a squirt bottle and gauze. Patient then applied telfa and ABD pad and secured with ACE wrap. I assisted patient with holding gauze while he wrapped his leg with an clean ACE wrap. Filed Vitals:   03/03/15 1032  BP: 102/62  Pulse: 76  Resp: 18  SpO2: 96%   Review of Systems  Cardiovascular: Positive for leg swelling.  Genitourinary: Negative for dysuria.  Musculoskeletal: Negative.   Skin: Positive for itching.       Reports wounds on both legs.    Physical Exam  Constitutional: He is oriented to person, place, and time. He appears well-developed and well-nourished.  Cardiovascular: Regular rhythm.   Respiratory: Effort normal and breath sounds normal.  Lungs clear. No distress. Respirations even and unlabored.  GI: Soft. Bowel sounds are normal.  Musculoskeletal: Normal range of motion. He exhibits edema.  2 plus edema in both legs.  Neurological: He is alert and oriented to person, place, and time.  Skin: Skin is warm and dry. Lesion noted.     Psychiatric: He has a normal mood and affect. His behavior is normal. Judgment and thought content normal.    Current Medications:   Current Outpatient Prescriptions  Medication Sig Dispense Refill  . acetaminophen (TYLENOL) 500 MG tablet Take 500 mg by mouth every 6 (six) hours as needed.    Marland Kitchen  albuterol (ACCUNEB) 1.25 MG/3ML nebulizer solution Take 1 ampule by nebulization every 6 (six) hours as needed.    . ALBUTEROL SULFATE HFA IN Inhale 2 puffs into the lungs as needed.    Marland Kitchen albuterol-ipratropium (COMBIVENT) 18-103 MCG/ACT inhaler Inhale 2 puffs into the lungs every 4 (four) hours. Patient has been taking this need as needed only.    Marland Kitchen atorvastatin (LIPITOR) 20 MG tablet Take 20 mg by mouth daily.    . carvedilol (COREG) 6.25 MG tablet Take 6.25 mg by mouth 2 (two) times daily with a meal.    . furosemide (LASIX) 40 MG tablet Take 160 mg  by mouth 2 (two) times daily. Patient take 4 tablets 40 mg each    . ipratropium (ATROVENT) 0.02 % nebulizer solution Take 0.5 mg by nebulization 4 (four) times daily. Patient reports that he takes his med as needed only    . lisinopril (PRINIVIL,ZESTRIL) 5 MG tablet Take 5 mg by mouth daily.    Marland Kitchen loperamide (IMODIUM) 2 MG capsule Take 1 capsule by mouth as needed.    . metolazone (ZAROXOLYN) 5 MG tablet Take 5 mg by mouth as needed. Patient reports that he takes this med once or twice a week depending on weight gain.    . Tiotropium Bromide-Olodaterol 2.5-2.5 MCG/ACT AERS Inhale 2 puffs into the lungs 1 day or 1 dose. Patient was taking as needed only    . traMADol (ULTRAM) 50 MG tablet Take 50 mg by mouth 3 (three) times daily as needed.    . warfarin (COUMADIN) 5 MG tablet Take 5 mg by mouth daily. Patient has his own INR machine and checks his own labs.     No current facility-administered medications for this visit.    Functional Status:   In your present state of health, do you have any difficulty performing the following activities: 02/27/2015 02/03/2015  Hearing? N N  Vision? N N  Difficulty concentrating or making decisions? N N  Walking or climbing stairs? Y Y  Dressing or bathing? N N  Doing errands, shopping? Y N  Preparing Food and eating ? N Y  Using the Toilet? N N  In the past six months, have you accidently leaked urine? N Y  Do you have problems with loss of bowel control? N N  Managing your Medications? N N  Managing your Finances? N N  Housekeeping or managing your Housekeeping? [name]    Fall/Depression Screening:    PHQ 2/9 Scores 02/27/2015 02/03/2015  PHQ - 2 Score 0 0  Exception Documentation Patient refusal -    Assessment:   Medications reviewed by pharmacist during home visit. (1) Unable to check INR. Reports that he is not taking his coumadin at this time.  (2) Not able to weigh. (3) Wounds on legs.  (4) Reports that he has paperwork together and needs to  take it to the local [place] office. (5) Breathing much improved since last home visit.  Plan:  (1) Encouraged patient to go to [facility] and get his labs drawn today. Encouraged patient to report his readings to Dr. Bettina Gavia. (2) Encouraged patient to get a battery and weigh ASAP. Encouraged patient to log weights. Patient has agreed to get a battery today. (3) Encouraged patient to see Dr. Cathi Roan this week. I offered to call and make an appointment and patient wanted to do this himself. Assisted patient during home visit to re wrap his leg. Reviewed with patient options for wound care. Declined wanting  to go to wound center. Declined ability to get home health nurse because his is not home bound. Encouraged patient to inquire about getting dressings changed at MD office.  Encouraged patient to get written directions on how to care for his wounds form MD.  (4) Patient to return his paperwork this week to the local [place] office. (5) Patient will continue to take his medications as prescribed.   [facility] CM Care Plan Problem One        Patient Outreach from 03/03/2015 in [employer] Most recent reading at 03/03/2015  1:44 PM   Care Plan Problem One  Knowledge deficit related to copd as evidence by lack of understanding about  daily maintenance .   Care Plan for Problem One  Active   THN Long Term Goal (31-90 days)  Patient will be able to report no admissions related to COPD in the next 60 days.    THN Long Term Goal Start Date  02/03/15   Interventions for Problem One Long Term Goal  Reinforced the need to understand COPD zones.Reviewed and disucssed COPD zones.. Reviewed the importance of early recognition of signs of changes in COPD and seeking early medical attention.   THN CM Short Term Goal #1 (0-30 days)  Patient will be able to verbalize understanding of CPOD zones and action plan in the next 30 days.   THN CM Short Term Goal #1 Start Date  02/03/15   [facility] CM Short Term Goal #1 Met Date   03/03/15 Barrie Folk met. ]   Interventions for Short Term Goal #1  Reviewed COPD zones. Provided [facility] calendar with COPD zones included for patient to review. Provided EMMI education on COPD during home visit for [place] Problem Two        Patient Outreach from 03/03/2015 in [employer] Most recent reading at 03/03/2015  1:44 PM   Care Plan Problem Two  Knowledge deficit related to Congestive Heart Failure as eveidence by lack of daily weights.    Care Plan for Problem Two  Active   Interventions for Problem Two Long Term Goal   Reviewed heart failure zones with patient. Reviewed importance of daily weights. Discussed importance of weights first things in the morning. Discussed importance of recording weights. Provided [facility] calendar to record weights. Todays weight recorded  in calendar.   THN Long Term Goal (31-90) days  Patient will be able to verbalize no admissions related to heart failure in the next 60 days.    THN Long Term Goal Start Date  02/03/15   [facility] Long Term Goal Met Date  -- Barrie Folk not met. Scale is not working. Start date restarted.]   THN CM Short Term Goal #1 (0-30 days)  Patient will record daily weights for the next 30 days.   THN CM Short Term Goal #1 Start Date  03/03/15 [date restarted]   Interventions for Short Term Goal #2   Encouraged patient to get a new battery for his personal scale.ReinforcedReviewed importance of calling MD with weight gain of 2-3 pounds over night or 5 pounds in a week.i    [place] Problem Three        Patient Outreach from 03/03/2015 in [employer] Most recent reading at 03/03/2015  1:44 PM   Care Plan Problem Three  Alteration in skin integrity related to open wounds on legs.   Care Plan for Problem Three  Active   [facility] Long  Term Goal (31-90) days  Patient will be able to verbalized wounds are healed in the next 60 days.   THN Long Term Goal Start Date  03/03/15   Interventions for Problem Three Long Term  Goal  Reviewed options for wound care for example the wound center. Discussed signs and symptoms of infections. Encoaurged patient to be seen by primary MD for wounds on legs.   THN CM Short Term Goal #1 (0-30 days)  Patient will be able to verbalize that saw his  primary MD in 1 week to assess wound on legs.   THN CM Short Term Goal #1 Start Date  03/03/15   Interventions for Short Term Goal #1  Reviewed importance of taking care of wounds on legs. Enocuraged patient to see primary MD and get instructions in writing about how to care for legs. Offered to call MD office and make patient an appoitment but he declined this offer.   THN CM Short Term Goal #2 (0-30 days)  Patient will be able to report improvement in legs wounds in the next 30 days.   THN CM Short Term Goal #2 Start Date  03/03/15   Interventions for Short Term Goal #2  Discussed importance of wound care, elevation of legs and early notification of signs of infection.     Next home visit on September 12th 2016. Collaborative goal setting and care planning with patient. Priority goal is wound care and improvement of skin integrity.  This note to be sent primary MD.  [name], RN, BSN, CEN [place] Coordinator [phone]

## 2015-03-03 NOTE — Patient Outreach (Signed)
[place] [facility]) Care Management  [place]   03/03/2015  Christian Grimes [birth year] [id number]  Subjective: Christian Grimes is a 69 year old male who I am following for medication management.  Joint visit with [place], [name], RN.    Christian Grimes has COPD and he has been prescribed several inhalers (maintenance and rescue) for management of COPD.  Patient reports he continues to use his maintenance inhaler (tiotropium/olodaterol) daily as prescribed.  He reports his breathing is better and he has only needed to use his rescue medication once in the past week.    Christian Grimes has not checked his INR since I spoke to him on Friday (02/28/15).  At that time, his INR was 5.1.  He states he ran out of strips for his home INR machine over the weekend.  He said he placed an order for more strips on Saturday, 03/01/15, but he has not received them in the mail yet.  Christian Grimes reports he has not taken any warfarin since Thursday, 02/27/15. He denies any signs or symptoms of bleeding.    Christian Grimes is in the process of establishing care with the [place].  He said he called the [facility] and left a message, but has not heard back from them yet.  He did talk to someone at the [facility] that provided him with the paperwork to fill out. He states he has filled out the paperwork and plans to turn it in this week.    Objective:   Current Medications: Current Outpatient Prescriptions  Medication Sig Dispense Refill  . acetaminophen (TYLENOL) 500 MG tablet Take 500 mg by mouth every 6 (six) hours as needed.    Marland Kitchen albuterol (ACCUNEB) 1.25 MG/3ML nebulizer solution Take 1 ampule by nebulization every 6 (six) hours as needed.    . ALBUTEROL SULFATE HFA IN Inhale 2 puffs into the lungs as needed.    Marland Kitchen albuterol-ipratropium (COMBIVENT) 18-103 MCG/ACT inhaler Inhale 2 puffs into the lungs every 4 (four) hours. Patient has been taking this need as needed only.    Marland Kitchen atorvastatin (LIPITOR)  20 MG tablet Take 20 mg by mouth daily.    . carvedilol (COREG) 6.25 MG tablet Take 6.25 mg by mouth 2 (two) times daily with a meal.    . furosemide (LASIX) 40 MG tablet Take 160 mg by mouth 2 (two) times daily. Patient take 4 tablets 40 mg each    . ipratropium (ATROVENT) 0.02 % nebulizer solution Take 0.5 mg by nebulization 4 (four) times daily. Patient reports that he takes his med as needed only    . lisinopril (PRINIVIL,ZESTRIL) 5 MG tablet Take 5 mg by mouth daily.    Marland Kitchen loperamide (IMODIUM) 2 MG capsule Take 1 capsule by mouth as needed.    . metolazone (ZAROXOLYN) 5 MG tablet Take 5 mg by mouth as needed. Patient reports that he takes this med once or twice a week depending on weight gain.    . Tiotropium Bromide-Olodaterol 2.5-2.5 MCG/ACT AERS Inhale 2 puffs into the lungs 1 day or 1 dose. Patient was taking as needed only    . traMADol (ULTRAM) 50 MG tablet Take 50 mg by mouth 3 (three) times daily as needed.    . warfarin (COUMADIN) 5 MG tablet Take 5 mg by mouth daily. Patient has his own INR machine and checks his own labs.     No current facility-administered medications for this visit.    Functional  Status: In your present state of health, do you have any difficulty performing the following activities: 02/27/2015 02/03/2015  Hearing? N N  Vision? N N  Difficulty concentrating or making decisions? N N  Walking or climbing stairs? Y Y  Dressing or bathing? N N  Doing errands, shopping? Y N  Preparing Food and eating ? N Y  Using the Toilet? N N  In the past six months, have you accidently leaked urine? N Y  Do you have problems with loss of bowel control? N N  Managing your Medications? N N  Managing your Finances? N N  Housekeeping or managing your Housekeeping? [name]    Fall/Depression Screening: PHQ 2/9 Scores 02/27/2015 02/03/2015  PHQ - 2 Score 0 0  Exception Documentation Patient refusal -    Assessment: 1.  Anticoagulation: Christian Grimes is unable to check his INR  today as he is out of his INR test strips.  Patient has ordered strips and should be receiving them in the mail.  Patient has not taken warfarin in 4 days.    2.  COPD: Per his report, he continues to use his maintenance inhaler daily as prescribed which has resulted in better control of his COPD as evident by less frequent use of rescue medications (currently using approximately one time per week).  Patient has noticed an improvement in breathing since using maintenance inhaler daily as prescribed.   Plan: 1. Christian Grimes will go to Dr. Leonette Most office at [place] today to get his INR tested.   2. Christian Grimes will call Dr. Joya Gaskins office directly to report INR results and determine plan of care.   3. I reviewed signs and symptoms of bleeding, and informed him to present to the ER if bleeding occurs.  I also educated him on the reason he is being anticoagulated with warfarin (AFib), and reviewed his INR goal of 2 to 3.  I provided him with EMMI articles about AFib and importance of monitoring warfarin.  4. Encouraged him to continue to use medications as prescribed.  Reinforced importance of using maintenance inhaler daily even when he does not feel that he needs it.  5. Christian Grimes will take his completed paperwork to [facility] in [place].  6. I will call Christian Grimes next week on August 22nd to follow up to answer any questions regarding EMMI materials.    [facility] CM Care Plan Problem One        Patient Outreach from 03/03/2015 in [place] Problem One  Knowledge of VA benefits for prescription medications   Care Plan for Problem One  Active   THN Long Term Goal (31-90 days)  Patient will find out percent service connection from the [place] in the next 90 days evidence by patient report.    THN Long Term Goal Start Date  02/17/15   Interventions for Problem One Long Term Goal  Patient has paperwork from [place]. He has it filled out and plans to return the paperwork  to the [place] this week. Will follow up with patient next week to follow up on the process of establishing care with the [place].    THN CM Short Term Goal #1 (0-30 days)  Patient will call the [facility] to try to get an appointment set up with a primary care provider at the [facility] in the next 30 days, evidence by patient report.     THN CM Short Term Goal #1 Start  Date  02/17/15   Interventions for Short Term Goal #1  Patient called the [facility] and is in the process of completing paperwork.     [facility] CM Care Plan Problem Two        Patient Outreach from 03/03/2015 in [place] Problem Two  Use of maintenance inhalers for COPD.    Care Plan for Problem Two  Active   Interventions for Problem Two Long Term Goal   Patient continues to use maintenance inhaler (Stiolto) daily as prescribed. I reinforced the importance of using his maintenance inhaler daily to help minimize use of rescue inhalers.    THN Long Term Goal (31-90) days  Patient will continue to use maintenance inhalers scheduled daily for the next 90 days per patient report.    THN Long Term Goal Start Date  02/17/15   THN CM Short Term Goal #1 (0-30 days)  Reduce the use of rescue medications in the next 30 days per patient report.    THN CM Short Term Goal #1 Start Date  02/17/15   [facility] CM Short Term Goal #1 Met Date   03/03/15   Interventions for Short Term Goal #2   Patient reports he has only needed to use rescue medications once in the past week. Encouraged patient to continue to use rescue inhalers only when needed.     [place] Problem Three        Patient Outreach from 03/03/2015 in [place] Problem Three  Knowledge of atrial fibrillation   Care Plan for Problem Three  Active   THN Long Term Goal (31-90) days  Patient will understand atrial fibrillation is the indication for anticoagulant therapy in the next 90 days per patient report.    THN Long Term Goal Start Date   03/03/15   Interventions for Problem Three Long Term Goal  Provided patient with educational material about artial fibrillation and the risk of stroke.    THN CM Short Term Goal #1 (0-30 days)  Patient will know his INR goal in the next 30 days per patient report.    THN CM Short Term Goal #1 Start Date  03/03/15   Interventions for Short Term Goal #1  I provided patient with EMMI article about monitoring warfarin and counseled patient on INR goal of 2 to 3.      Elisabeth Most, Pharm.D. Pharmacy Resident [place]

## 2015-03-04 ENCOUNTER — Other Ambulatory Visit: Payer: Self-pay

## 2015-03-04 ENCOUNTER — Other Ambulatory Visit: Payer: Self-pay | Admitting: Pharmacist

## 2015-03-04 NOTE — Patient Outreach (Signed)
[facility]) Care Management  03/04/2015  [name] 1946/07/06 [id number]  Mr. Pua called me following my home visit to update me on his lab results from his PCP office.  Mr. Greth reports his INR today was 2.8.  He states he called Dr. Hulen Shouts office to inform them of his results, but he has not heard back about his plan of care.  On Friday, 02/28/15, Mr. [facility] cardiology office had instructed him to continue to hold warfarin if his INR is greater than 3 or take  of a  tablet if his INR is less than 3.  Patient said he plans to take  a tablet, but wanted to confirm with his cardiology office to be sure.  Mr. Candelas states he has an appointment with his PCP, Dr. Elton Sin on 03/04/15 to have his legs looked at, and he can get INR checked at PCP office again while he is waiting for his INR test strips to arrive in the mail.  Mr. Fredin also states he plans to go to the [facility] to drop off paperwork today.  I will call Mr. Hagin next week on August 22nd to follow up.   Lilla Shook, Pharm.D. Pharmacy Resident [facility] [employer]

## 2015-03-04 NOTE — Patient Outreach (Signed)
Care Coordination:  Patient called and left me a voicemail. I attempted x 3 to call patient back without success.  I received call from pharmacy resident stating that patient had his INR checked and that he will see primary MD on 03/04/2015.  Patient also reported that he put a battery in his scale and was going to the [place] to drop off his paperwork.  Rowe Pavy, RN, BSN, CEN [facility] [phone]

## 2015-03-06 ENCOUNTER — Ambulatory Visit: Payer: Medicare Other | Admitting: Pharmacist

## 2015-03-10 ENCOUNTER — Other Ambulatory Visit: Payer: Self-pay | Admitting: Pharmacist

## 2015-03-10 NOTE — Patient Outreach (Signed)
[facility]) Care Management  [facility] CM Pharmacy   03/10/2015  Christian Grimes March 03, 1946 [id number]   [name] is a 69 year old male who I am following for medication management.  I called [name] to follow up on his INR, his application status for VA care, and to review the EMMI material assigned to him on 03/03/15. Patient did not answer the telephone. I left HIPAA compliant voice message for the patient to return my phone call. I will reach out on Tuesday 03/11/15 if the patient does not return my call today.     Lilla Shook, Pharm.D. Pharmacy Resident [facility] [employer]

## 2015-03-11 ENCOUNTER — Other Ambulatory Visit: Payer: Self-pay | Admitting: Pharmacist

## 2015-03-11 NOTE — Patient Outreach (Signed)
[facility] [facility]) Care Management  [place]   03/11/2015  JYE FARISS 1945/11/22 [id number]  Subjective: Mr. Rentz is a 69 year old male who I am following for medication management.  I called Mr. Karis to follow up on his INR, his breathing, his application status for VA care, and to review the EMMI material assigned to him on 03/03/15.  Mr. [name] reports he is doing good.  He reports his breathing is good and he continues to use his Stiolto daily as prescribed.    He said his new test strips came in the mail last week.  He last checked his INR on 03/05/15 and reports is was 2.3 at that time.  He is currently taking warfarin 2.83m (1/2 of a 557mtablet) daily as instructed by his cardiologist.  He states he plans to check his INR today.  Patient checked INR while I was on the phone and his INR today is 2.6.  Patient was able to repeat his INR goal of 2 to 3 to me.  Patient denied having any questions about EMMI material but states it was very informative and helped him understand more about warfarin therapy.    Mr. WhFeisters [place] He [facility] yet.      Objective:  03/05/15: INR 2.3 03/11/15: INR 2.6   Current Medications: Current Outpatient Prescriptions  Medication Sig Dispense Refill  . acetaminophen (TYLENOL) 500 MG tablet Take 500 mg by mouth every 6 (six) hours as needed.    . Marland Kitchenlbuterol (ACCUNEB) 1.25 MG/3ML nebulizer solution Take 1 ampule by nebulization every 6 (six) hours as needed.    . ALBUTEROL SULFATE HFA IN Inhale 2 puffs into the lungs as needed.    . Marland Kitchenlbuterol-ipratropium (COMBIVENT) 18-103 MCG/ACT inhaler Inhale 2 puffs into the lungs every 4 (four) hours. Patient has been taking this need as needed only.    . Marland Kitchentorvastatin (LIPITOR) 20 MG tablet Take 20 mg by mouth daily.    . carvedilol (COREG) 6.25 MG tablet Take 6.25 mg  by mouth 2 (two) times daily with a meal.    . furosemide (LASIX) 40 MG tablet Take 160 mg by mouth 2 (two) times daily. Patient take 4 tablets 40 mg each    . ipratropium (ATROVENT) 0.02 % nebulizer solution Take 0.5 mg by nebulization 4 (four) times daily. Patient reports that he takes his med as needed only    . lisinopril (PRINIVIL,ZESTRIL) 5 MG tablet Take 5 mg by mouth daily.    . Marland Kitchenoperamide (IMODIUM) 2 MG capsule Take 1 capsule by mouth as needed.    . metolazone (ZAROXOLYN) 5 MG tablet Take 5 mg by mouth as needed. Patient reports that he takes this med once or twice a week depending on weight gain.    . Tiotropium Bromide-Olodaterol 2.5-2.5 MCG/ACT AERS Inhale 2 puffs into the lungs 1 day or 1 dose. Patient was taking as needed only    . traMADol (ULTRAM) 50 MG tablet Take 50 mg by mouth 3 (three) times daily as needed.    . warfarin (COUMADIN) 5 MG tablet Take 5 mg by mouth daily. Patient has his own INR machine and checks his own labs.     No current facility-administered medications for this visit.    Functional Status: In your present state of health, do you have any  difficulty performing the following activities: 02/27/2015 02/03/2015  Hearing? N N  Vision? N N  Difficulty concentrating or making decisions? N N  Walking or climbing stairs? Y Y  Dressing or bathing? N N  Doing errands, shopping? Y N  Preparing Food and eating ? N Y  Using the Toilet? N N  In the past six months, have you accidently leaked urine? N Y  Do you have problems with loss of bowel control? N N  Managing your Medications? N N  Managing your Finances? N N  Housekeeping or managing your Housekeeping? [name]    Fall/Depression Screening: PHQ 2/9 Scores 02/27/2015 02/03/2015  PHQ - 2 Score 0 0  Exception Documentation Patient refusal -    Assessment: 1.  Anticoagulation: Mr. Whetsel INR is currently at goal on a regimen of warfarin 2.9m daily.  Patient knows his INR goal and is able to state goal when  asked.    2.  COPD: Per his report, he continues to use his maintenance inhaler daily as prescribed which has resulted in better control of his COPD as evident by less frequent use of rescue medications (currently using approximately one time per week).  Patient has noticed an improvement in breathing since using maintenance inhaler daily as prescribed.   Plan: 1. Patient to continue to take medications as prescribed.   2. Reinforced importance of using maintenance inhaler daily even when he does not feel that he needs it and to call his provider for more samples or a prescription when he is getting low on his current samples.   3. Mr. WGluthwill update cardiologist office on INR of 2.6 today and find out plan of care for future monitoring.   4. I will call Mr. Burch in two weeks to follow up.   [facility] Problem One        Patient Outreach Telephone [place] 03[place]/2015 [place]  Knowledge of VA benefits for prescription medications   Care Plan for Problem One  Active   THN Long Term Goal (31-90 days)  Patient will find [place] evidence by patient report.    THN Long Term Goal Start Date  02/17/15   Interventions for Problem One Long Term Goal  Patient turned in paper work last week.  He [place] not heard [place] the [place]. Will follow up with patient in two weeks.    THN CM Short Term Goal #1 (0-30 days)  [facility], evidence by patient report.     THN CM Short Term Goal #1 Start Date  02/17/15   Interventions for Short Term Goal #1  [facility]     [facility] Care Plan Problem Two        Patient Outreach Telephone from 03[place]11/2015 [place]  Use of maintenance inhalers for COPD.    Care Plan for  Problem Two  Active   Interventions for Problem Two Long Term Goal   Patient continues to use maintenance inhaler (Stiolto) daily as prescribed. Patient is currently using samples of Stiolto, I educated patient that some of his samples were only 2 weeks supply and to let his  PCP know when he is getting low on his supply of samples.    THN Long Term Goal (31-90) days  Patient will continue to use maintenance inhalers scheduled daily for the next 90 days per patient report.    THN Long Term Goal Start Date  02/17/15   THN CM Short Term Goal #1 (0-30 days)  Reduce the use of rescue medications in the next 30 days per patient report.    THN CM Short Term Goal #1 Start Date  02/17/15   [facility] CM Short Term Goal #1 Met Date   03/03/15   Interventions for Short Term Goal #2   Patient reports he has only needed to use rescue medications once in the past week. Encouraged patient to continue to use rescue inhalers only when needed.     [place] Problem Three        Patient Outreach Telephone from 03/11/2015 in [place] Problem Three  Knowledge of atrial fibrillation   Care Plan for Problem Three  Active   THN Long Term Goal (31-90) days  Patient will understand atrial fibrillation is the indication for anticoagulant therapy in the next 90 days per patient report.    THN Long Term Goal Start Date  03/03/15   Interventions for Problem Three Long Term Goal  Rereviewed EMMI material with patient.    THN CM Short Term Goal #1 (0-30 days)  Patient will know his INR goal in the next 30 days per patient report.    THN CM Short Term Goal #1 Start Date  03/03/15   [facility] CM Short Term Goal #1 Met Date  03/11/15   Interventions for Short Term Goal #1  Patient was able to state INR goal today.  I educated him on the importance of remembering INR goal especially since he is monitoring INR at home.       Elisabeth Most, Pharm.D. Pharmacy Resident [place]

## 2015-03-26 ENCOUNTER — Other Ambulatory Visit: Payer: Self-pay | Admitting: Pharmacist

## 2015-03-26 NOTE — Patient Outreach (Signed)
[facility] [facility]) Care Management  [place]   03/26/2015  Christian Grimes [birth year] [id number]  Subjective: Christian Grimes is a 69 year old male who I am following for medication management.  I called Christian Grimes to follow up on his INR, his breathing, and his application status for care at the [place].    Christian Grimes reports he is doing alright.  He reports his breathing is good except for some shortness of breath when walking to the mailbox.  He continues to use his Stiolto daily as prescribed and has 2 to 3 samples of Stiolto inhalers left.  He reports he has not required his rescue medications in a week or so.    Christian Grimes reports he has not checked his INR this week, but was planning to check it today.  When asked, Christian Grimes was able to state his INR goal range of 2 to 3 and state the reason he is on warfarin.  He is currently taking warfarin 2.56m (1/2 of a 533mtablet) daily as instructed by his cardiologist.  Patient checked his INR while I was on the phone and his INR today is 1.6.  [name] eating any greens this week, and he does not believe he has missed any doses of warfarin this week.    Christian Grimes [place] the [place] He [facility].  He said he is going to find out more about medication [place] then determine whether he should [place]   Objective:  03/26/15: INR 1.6   Current Medications: Current Outpatient Prescriptions  Medication Sig Dispense Refill  . acetaminophen (TYLENOL) 500 MG tablet Take 500 mg by mouth every 6 (six) hours as needed.    . Marland Kitchenlbuterol (ACCUNEB) 1.25 MG/3ML nebulizer solution Take 1 ampule by nebulization every 6 (six) hours as needed.    . ALBUTEROL SULFATE HFA IN Inhale 2 puffs into the lungs as needed.    . Marland Kitchenlbuterol-ipratropium (COMBIVENT) 18-103 MCG/ACT inhaler  Inhale 2 puffs into the lungs every 4 (four) hours. Patient has been taking this need as needed only.    . Marland Kitchentorvastatin (LIPITOR) 20 MG tablet Take 20 mg by mouth daily.    . carvedilol (COREG) 6.25 MG tablet Take 6.25 mg by mouth 2 (two) times daily with a meal.    . furosemide (LASIX) 40 MG tablet Take 160 mg by mouth 2 (two) times daily. Patient take 4 tablets 40 mg each    . ipratropium (ATROVENT) 0.02 % nebulizer solution Take 0.5 mg by nebulization 4 (four) times daily. Patient reports that he takes his med as needed only    . lisinopril (PRINIVIL,ZESTRIL) 5 MG tablet Take 5 mg by mouth daily.    . Marland Kitchenoperamide (IMODIUM) 2 MG capsule Take 1 capsule by mouth as needed.    . Tiotropium Bromide-Olodaterol 2.5-2.5 MCG/ACT AERS Inhale 2 puffs into the lungs 1 day or 1 dose. Patient was taking as needed only    . warfarin (COUMADIN) 5 MG tablet Take 5 mg by mouth daily. Patient has his own INR machine and checks his own labs.    . metolazone (ZAROXOLYN) 5 MG tablet Take 5 mg by mouth as needed. Patient reports that he takes this med once or twice a week depending on weight gain.    .Marland Kitchen  traMADol (ULTRAM) 50 MG tablet Take 50 mg by mouth 3 (three) times daily as needed.     No current facility-administered medications for this visit.    Functional Status: In your present state of health, do you have any difficulty performing the following activities: 02/27/2015 02/03/2015  Hearing? N N  Vision? N N  Difficulty concentrating or making decisions? N N  Walking or climbing stairs? Y Y  Dressing or bathing? N N  Doing errands, shopping? Y N  Preparing Food and eating ? N Y  Using the Toilet? N N  In the past six months, have you accidently leaked urine? N Y  Do you have problems with loss of bowel control? N N  Managing your Medications? N N  Managing your Finances? N N  Housekeeping or managing your Housekeeping? [name]    Fall/Depression Screening: PHQ 2/9 Scores 02/27/2015 02/03/2015  PHQ - 2  Score 0 0  Exception Documentation Patient refusal -    Assessment: 1.  Anticoagulation: Christian Grimes INR is currently below goal on a regimen of warfarin 2.59m daily.  Patient knows his INR goal and indication for warfarin and is able to state both when asked.  Christian Grimes that he needs to call cardiology office with his INR results today, and he is aware that he should directly call the cardiology office when his INR is not at goal.  I counseled patient to be sure to update his cardiologist on any changes to his medications.    2.  COPD: Per his report, he continues to use his maintenance inhaler daily as prescribed and states his breathing has been good.  Patient continues to notice that his breathing has been improved since he started using his maintenance inhaler daily as prescribed.   Plan: 1. Patient to continue to take medications as prescribed.    2. Reinforced importance of using maintenance inhaler daily even when he does not feel that he needs it and to call his provider for more samples or a prescription when he is getting low on his current samples.    3. Christian Grimes update cardiologist office on INR of 1.6 today and find out plan of care.  Christian Grimes continue to check INR as directed by cardiology office and continue to follow up with his cardiologist regarding his warfarin dose.   4. Patient has no further pharmacy needs at this time.  Patient has my phone number should any additional needs arise.  Will close out of pharmacy program as goals have been met.  Will update THN RNCM, ATomasa Rand of case closure for pharmacy.    [facility] Problem [facility]        Most Recent Value   Care Plan Problem One  Knowledge of VA benefits for prescription medications   Role Documenting the Problem One  Clinical Pharmacist   Care Plan for Problem One  Not Active   THN Long Term Goal (31-90 days)  Patient will find out percent [place]  evidence by patient report.    THN Long Term Goal Start Date  02/17/15   THN Long Term Goal Met Date  03/26/15   Interventions for Problem One Long Term Goal  Patient states [place] [facility].  At that time, he will learn more about prescription cost through the  VA and determine if he wants to have his medications filled at the [place].    THN CM Short Term Goal #1 (0-30 days)  Patient will call the [facility] to try to get an appointment set up with a primary care provider at the [facility] in the next 30 days, evidence by patient report.     THN CM Short Term Goal #1 Start Date  02/17/15   [facility] CM Short Term Goal #1 Met Date  03/26/15   Interventions for Short Term Goal #1  Patient has an appointment scheduled [facility] the [facility] at the end of the month.     THN CM Care Plan Problem Two        Most Recent Value   Care Plan Problem Two  Use of maintenance inhalers for COPD.    Role Documenting the Problem Two  Clinical Pharmacist   Care Plan for Problem Two  Not Active   Interventions for Problem Two Long Term Goal   Patient continues to use maintenance inhaler (Stiolto) daily as prescribed. Patient is currently using samples of Stiolto, I educated patient that some of his samples were only 2 weeks supply and to let his PCP know when he is getting low on his supply of samples.    THN Long Term Goal (31-90) days  Patient will continue to use maintenance inhalers scheduled daily for the next 90 days per patient report.    THN Long Term Goal Start Date  02/17/15   THN Long Term Goal Met Date  03/26/15   THN CM Short Term Goal #1 (0-30 days)  Reduce the use of rescue medications in the next 30 days per patient report.    THN CM Short Term Goal #1 Start Date  02/17/15   [facility] CM Short Term Goal #1 Met Date   03/03/15   Interventions for Short Term Goal #2   Patient reports he has only needed to use rescue medications once  in the past week. Encouraged patient to continue to use rescue inhalers only when needed.     [facility] CM Care Plan Problem Three        Most Recent Value   Care Plan Problem Three  Knowledge of atrial fibrillation   Role Documenting the Problem Three  Clinical Pharmacist   Care Plan for Problem Three  Not Active   THN Long Term Goal (31-90) days  Patient will understand atrial fibrillation is the indication for anticoagulant therapy in the next 90 days per patient report.    THN Long Term Goal Start Date  03/03/15   THN Long Term Goal Met Date  03/26/15   Interventions for Problem Three Long Term Goal  When asked, patient was able to state reason he takes warfarin.    THN CM Short Term Goal #1 (0-30 days)  Patient will know his INR goal in the next 30 days per patient report.    THN CM Short Term Goal #1 Start Date  03/03/15   [facility] CM Short Term Goal #1 Met Date  03/11/15   Interventions for Short Term Goal #1  Patient was able to state INR goal today.  I educated him on the importance of remembering INR goal especially since he is monitoring INR at home.      Elisabeth Most, Pharm.D. Pharmacy Resident [place]

## 2015-03-27 ENCOUNTER — Other Ambulatory Visit: Payer: Self-pay

## 2015-03-27 ENCOUNTER — Other Ambulatory Visit: Payer: Self-pay | Admitting: Pharmacist

## 2015-03-27 NOTE — Patient Outreach (Signed)
Care coordination: Returned patients call. Patient report that he is waiting for a call back from cardiologist. Reports that he had a rough night and rough day. Encouraged patient to call primary MD.  Patient appears to be in a hurry to get off the phone. i reminded patient of home visit appointment on Monday and to call me if needed. Patient agreed and hung up.  Rowe Pavy, RN, BSN, CEN [facility] [employer] [phone]

## 2015-03-27 NOTE — Patient Outreach (Signed)
[facility]) Care Management  03/27/2015  [name] 02/22/1946 [id number]   I received a phone call from [name].  He said he had a bad night last night.  He reports he felt very cold and could not get warm.  He said he placed several blankets on him including one he warmed up in the dryer but he still felt cold.  He also reported chest tightness that resolved after a nebulizer treatment and a sublingual nitrostat.  [name] wanted to know if the cold feeling could be associated with his blood thinner.  I counseled [name] that I did not feel these symptoms were related to his medication.   I recommended that [name] call his primary care physician, Dr. Elton Sin, regarding the symptoms he experienced last night.  [name] said he called his cardiologist office yesterday about his INR level of 1.6, but he has not been able to talk to them directly about the plan of care because he was not at home when they called him back.  He said he took his dose of warfarin yesterday after checking his INR and took a dose of .  I encouraged patient to call his cardiology office again regarding his INR level.    [name] is followed by [facility] RNCM, Rowe Pavy.  I alerted Marchelle Folks about the phone call from [name] and she said she will reach out to the patient.     Lilla Shook, Pharm.D. Pharmacy Resident [facility] [employer]

## 2015-03-31 ENCOUNTER — Other Ambulatory Visit: Payer: Self-pay

## 2015-03-31 VITALS — BP 106/62 | HR 82 | Resp 20 | Wt 274.2 lb

## 2015-03-31 DIAGNOSIS — J441 Chronic obstructive pulmonary disease with (acute) exacerbation: Secondary | ICD-10-CM

## 2015-03-31 NOTE — Patient Outreach (Signed)
[facility]) Care Management  03/31/2015  Christian Grimes 1945-08-24 [id number]   Request from Rowe Pavy, RN to assign RN Health Coach, assigned Tyler Deis, RN.  Thanks, [name]. [name] [facility] Care Management [facility] CM Assistant Phone: [phone] Fax: [phone]

## 2015-03-31 NOTE — Patient Outreach (Signed)
[facility]) Care Management   03/31/2015  Christian Grimes 09-14-1945 [id number]  Christian Grimes is an 69 y.o. male 10 AM home visit:  Arrived to find patient sitting at computer. Subjective: Patient reports that he is doing well. Reports that his breathing is much improved. Reports that he wears his oxygen 2 Liter nasal cannula. Reports that he no longer takes it off to do things which he thinks has improved his breathing. Reports that he is taking his medications as prescribed.  Denies current shortness of breath.  Patient reports that he has an appointment to go to [place] on 04/17/2015 for a new patient visit to get connected for [place] services. Reports that he is currently taking coumadin 60m for 3 days ( Friday, Saturday and Sunday) reports that he will check his INR today and call the cardiologist today for dosing instructions.  Patient denies any new problems or concerns.  Objective:  Patient ambulating without difficulty.  Patient looks well today and legs are improved from last months visit. Awake and alert. Filed Vitals:   03/31/15 1134  BP: 106/62  Pulse: 82  Resp: 20  Weight: 274 lb 3.2 oz (124.376 kg)  SpO2: 95%   Review of Systems  Constitutional: Negative.   HENT: Negative.   Respiratory: Negative for shortness of breath.   Cardiovascular: Positive for leg swelling.  Gastrointestinal: Negative.   Genitourinary: Negative.   Musculoskeletal: Negative.   Skin: Positive for itching.  Neurological: Negative.   Endo/Heme/Allergies: Bruises/bleeds easily.  Psychiatric/Behavioral: Negative.     Physical Exam  Constitutional: He is oriented to person, place, and time. He appears well-developed and well-nourished.  Cardiovascular: Normal rate.   Respiratory: Effort normal and breath sounds normal.  Respirations even and unlabored. Talking in complete sentences.  Musculoskeletal: Normal range of motion. He exhibits edema and tenderness.  Neurological: He is alert and  oriented to person, place, and time.  Skin: Skin is warm and dry. There is erythema.  Right leg wrapped in compression wrap by patient yesterday. Dressing not removed. No drainage on outside of compression wrap. .  Left skin with a large blister noted. Blister is fluid filled. Skin to left leg is dry but intact. Skin is intact to both feet.   2 plus edema to the right leg, 1 plus edema to the left leg.  Psychiatric: He has a normal mood and affect. His behavior is normal. Judgment and thought content normal.    Current Medications:   Current Outpatient Prescriptions  Medication Sig Dispense Refill  . acetaminophen (TYLENOL) 500 MG tablet Take 500 mg by mouth every 6 (six) hours as needed.    .Marland Kitchenalbuterol (ACCUNEB) 1.25 MG/3ML nebulizer solution Take 1 ampule by nebulization every 6 (six) hours as needed.    . ALBUTEROL SULFATE HFA IN Inhale 2 puffs into the lungs as needed.    .Marland Kitchenalbuterol-ipratropium (COMBIVENT) 18-103 MCG/ACT inhaler Inhale 2 puffs into the lungs every 4 (four) hours. Patient has been taking this need as needed only.    .Marland Kitchenatorvastatin (LIPITOR) 20 MG tablet Take 20 mg by mouth daily.    . carvedilol (COREG) 6.25 MG tablet Take 6.25 mg by mouth 2 (two) times daily with a meal.    . furosemide (LASIX) 40 MG tablet Take 160 mg by mouth 2 (two) times daily. Patient take 4 tablets 40 mg each    . ipratropium (ATROVENT) 0.02 % nebulizer solution Take 0.5 mg by nebulization 4 (four) times daily. Patient  reports that he takes his med as needed only    . lisinopril (PRINIVIL,ZESTRIL) 5 MG tablet Take 5 mg by mouth daily.    Marland Kitchen loperamide (IMODIUM) 2 MG capsule Take 1 capsule by mouth as needed.    . metolazone (ZAROXOLYN) 5 MG tablet Take 5 mg by mouth as needed. Patient reports that he takes this med once or twice a week depending on weight gain.    . Tiotropium Bromide-Olodaterol 2.5-2.5 MCG/ACT AERS Inhale 2 puffs into the lungs 1 day or 1 dose. Patient was taking as needed only    .  traMADol (ULTRAM) 50 MG tablet Take 50 mg by mouth 3 (three) times daily as needed.    . warfarin (COUMADIN) 5 MG tablet Take 5 mg by mouth daily. Patient has his own INR machine and checks his own labs.     No current facility-administered medications for this visit.    Functional Status:   In your present state of health, do you have any difficulty performing the following activities: 02/27/2015 02/03/2015  Hearing? N N  Vision? N N  Difficulty concentrating or making decisions? N N  Walking or climbing stairs? Y Y  Dressing or bathing? N N  Doing errands, shopping? Y N  Preparing Food and eating ? N Y  Using the Toilet? N N  In the past six months, have you accidently leaked urine? N Y  Do you have problems with loss of bowel control? N N  Managing your Medications? N N  Managing your Finances? N N  Housekeeping or managing your Housekeeping? [name]    Fall/Depression Screening:    PHQ 2/9 Scores 02/27/2015 02/03/2015  PHQ - 2 Score 0 0  Exception Documentation Patient refusal -    Assessment:   (1) Reports COPD doing well. Denies any concerns with breathing today. Taking all medications as prescribed. (2) [facility] (3) Leg improving,  Right leg with compression wrap applied by patient. (4) Denies any new problems, denies need for home visits at this time.   Plan:  (1) Patient will continue to take all meds as prescribed. Patient will be continue to notify MD with early symptoms of illness to avoid admissions in the future. (2) Patient will attend MD appointment. Verify that he has transportation plans. (3) Patient encouraged to call Dr. Cathi Roan about questions about getting Ardelia Mems boot at the [facility].  Encouraged patient to notify MD for any concerns about his legs in the future. (4) Reviewed progress in the last 3 months with patient today. Patient has decline needing home visits at this time.  Patient is interested in continued follow up with  health coach. I will place this order.  [facility] CM Care Plan Problem One        Most Recent Value   Care Plan Problem One  Knowledge deficit related to copd as evidence by lack of understanding about  daily maintenance .   Role Documenting the Problem One  Care Management [place] for Problem One  Active   THN Long Term Goal (31-90 days)  Patient will be able to report no admissions related to COPD in the next 60 days.    THN Long Term Goal Start Date  02/03/15   [facility] Long Term Goal Met Date  03/31/15 [facility] met. no readmissions]   Interventions for Problem One Long Term Goal  Reinforced the need to understand COPD zones.Reviewed and disucssed COPD zones.. Reviewed the  importance of early recognition of signs of changes in COPD and seeking early medical attention.   [facility]N CM Short Term Goal #1 (0-30 days)  Patient will be able to verbalize understanding of CPOD zones and action plan in the next 30 days.   [facility]N CM Short Term Goal #1 Start Date  02/03/15   [facility] CM Short Term Goal #1 Met Date  03/03/15 [facility] met. ]   Interventions for Short Term Goal #1  Reviewed COPD zones. Provided [facility] calendar with COPD zones included for patient to review. Provided EMMI education on COPD during home visit for pati   THN CM Short Term Goal #2 (0-30 days)  Patient will report understanding for reasons to call MD in the next 30 days.   [facility]N CM Short Term Goal #2 Start Date  03/31/15   Interventions for Short Term Goal #2  Encouraged patient to call MD when he has symptoms. reviewed benefits of early recognition of symptoms to avoid admissions. Order placed for health coach.    [facility] CM Care Plan Problem Two        Most Recent Value   Care Plan Problem Two  Knowledge deficit related to Congestive Heart Failure as eveidence by lack of daily weights.    Role Documenting the Problem Two  Care Management [place] for Problem Two  Active   Interventions for Problem Two Long Term Goal   Reviewed heart  failure zones with patient. Reviewed importance of daily weights. Discussed importance of weights first things in the morning. Discussed importance of recording weights. Provided [facility] calendar to record weights. Todays weight recorded  in calendar.   THN Long Term Goal (31-90) days  Patient will be able to verbalize no admissions related to heart failure in the next 60 days.    THN Long Term Goal Start Date  02/03/15   [facility] Long Term Goal Met Date  03/31/15 [facility] met. reports weighing daily.]   THN CM Short Term Goal #1 (0-30 days)  Patient will record daily weights for the next 30 days.   THN CM Short Term Goal #1 Start Date  03/03/15 [date restarted]   [facility] CM Short Term Goal #1 Met Date   03/31/15 [facility] Folk met, weighing daily]   Interventions for Short Term Goal #2   Encouraged patient to get a new battery for his personal scale.ReinforcedReviewed importance of calling MD with weight gain of 2-3 pounds over night or 5 pounds in a week.i    [facility] CM Care Plan Problem Three        Most Recent Value   Care Plan Problem Three  Alteration in skin integrity related to open wounds on legs.   Role Documenting the Problem Three  Care Management Coordinator   Care Plan for Problem Three  Active   THN Long Term Goal (31-90) days  Patient will be able to verbalized wounds are healed in the next 60 days.   THN Long Term Goal Start Date  03/03/15   [facility]N Long Term Goal Met Date  -- [update:  Legs are improving]   Interventions for Problem Three Long Term Goal  Encouraged patient to call MD to inquire about una boot.    THN CM Short Term Goal #1 (0-30 days)  Patient will be able to verbalize that saw his  primary MD in 1 week to assess wound on legs.   [facility]N CM Short Term Goal #1 Start Date  03/03/15   [facility] CM Short  Term Goal #1 Met Date  03/31/15 [facility] Folk met]   Interventions for Short Term Goal #1  Reviewed importance of taking care of wounds on legs. Enocuraged patient to see primary MD and get instructions in writing  about how to care for legs. Offered to call MD office and make patient an appoitment but he declined this offer.   [facility]N CM Short Term Goal #2 (0-30 days)  Patient will be able to report improvement in legs wounds in the next 30 days.   [facility]N CM Short Term Goal #2 Start Date  03/03/15   [facility] CM Short Term Goal #2 Met Date  03/31/15 [facility] met. see assessment]   Interventions for Short Term Goal #2  Discussed importance of wound care, elevation of legs and early notification of signs of infection.     [name], RN, BSN, CEN [facility] [employer] [phone]

## 2015-05-02 ENCOUNTER — Other Ambulatory Visit: Payer: Self-pay

## 2015-05-02 DIAGNOSIS — J441 Chronic obstructive pulmonary disease with (acute) exacerbation: Secondary | ICD-10-CM | POA: Insufficient documentation

## 2015-05-02 DIAGNOSIS — I509 Heart failure, unspecified: Secondary | ICD-10-CM | POA: Insufficient documentation

## 2015-05-02 NOTE — Patient Outreach (Signed)
[facility]) Care Management  [facility] Care Manager  05/02/2015   Christian Grimes 08-12-1945 [id number]  Telephonic assessment with Christian Grimes today.  He reports he did go to an appointment at the [facility], and has a return appointment in 6 months.  He reports he has gotten his flu shot.  Patient has a productive cough with 'milkly' colored sputum.  Reinforced teaching with him on "Yellow Zone" symptoms which might indicate an infection or an exacerbation of his COPD.  Nutrition assessment completed today.  Patient reports he only eats twice a day, with a frozen dinner being his main meal.  States he is looking for someone to stay with him and cook meals.  Patient has not been doing daily weights.  Reports weight of 277 about 3 weeks ago.  Encouraged to start weighing daily and keeping a record.  He reports chronic lower leg weeping edema, but that it is improved since last [facility] community nurse visit, and he no longer has to wrap his legs.   Current Medications:  Current Outpatient Prescriptions  Medication Sig Dispense Refill  . acetaminophen (TYLENOL) 500 MG tablet Take 500 mg by mouth every 6 (six) hours as needed.    Marland Kitchen albuterol (ACCUNEB) 1.25 MG/3ML nebulizer solution Take 1 ampule by nebulization every 6 (six) hours as needed.    . ALBUTEROL SULFATE HFA IN Inhale 2 puffs into the lungs as needed.    Marland Kitchen albuterol-ipratropium (COMBIVENT) 18-103 MCG/ACT inhaler Inhale 2 puffs into the lungs every 4 (four) hours. Patient has been taking this need as needed only.    Marland Kitchen atorvastatin (LIPITOR) 20 MG tablet Take 20 mg by mouth daily.    . carvedilol (COREG) 6.25 MG tablet Take 6.25 mg by mouth 2 (two) times daily with a meal.    . furosemide (LASIX) 40 MG tablet Take 160 mg by mouth 2 (two) times daily. Patient take 4 tablets 40 mg each    . ipratropium (ATROVENT) 0.02 % nebulizer solution Take 0.5 mg by nebulization 4 (four) times daily. Patient reports that he takes his med as  needed only    . lisinopril (PRINIVIL,ZESTRIL) 5 MG tablet Take 5 mg by mouth daily.    Marland Kitchen loperamide (IMODIUM) 2 MG capsule Take 1 capsule by mouth as needed.    . metolazone (ZAROXOLYN) 5 MG tablet Take 5 mg by mouth as needed. Patient reports that he takes this med once or twice a week depending on weight gain.    . Tiotropium Bromide-Olodaterol 2.5-2.5 MCG/ACT AERS Inhale 2 puffs into the lungs 1 day or 1 dose. Patient was taking as needed only    . traMADol (ULTRAM) 50 MG tablet Take 50 mg by mouth 3 (three) times daily as needed.    . warfarin (COUMADIN) 5 MG tablet Take 5 mg by mouth daily. Patient has his own INR machine and checks his own labs.     No current facility-administered medications for this visit.    Functional Status:  In your present state of health, do you have any difficulty performing the following activities: 02/27/2015 02/03/2015  Hearing? N N  Vision? N N  Difficulty concentrating or making decisions? N N  Walking or climbing stairs? Y Y  Dressing or bathing? N N  Doing errands, shopping? Y N  Preparing Food and eating ? N Y  Using the Toilet? N N  In the past six months, have you accidently leaked urine? N Y  Do you have problems  with loss of bowel control? N N  Managing your Medications? N N  Managing your Finances? N N  Housekeeping or managing your Housekeeping? Malvin JohnsY Y    Fall/Depression Screening: PHQ 2/9 Scores 02/27/2015 02/03/2015  PHQ - 2 Score 0 0  Exception Documentation Patient refusal -    Plan: Patient will weigh daily and record weights.           Patient will improve his nutritional intake, possibly by checking out having lunch at a [employer].           RN Health Coach will follow up within 2 weeks to complete assessment.  [name], RN, MSN RN [employer] InternationalHealth Coach [facility] [phone] Fax [phone]

## 2015-05-13 ENCOUNTER — Ambulatory Visit: Payer: Self-pay

## 2015-05-13 ENCOUNTER — Other Ambulatory Visit: Payer: Self-pay

## 2015-05-13 DIAGNOSIS — I509 Heart failure, unspecified: Secondary | ICD-10-CM

## 2015-05-13 DIAGNOSIS — J441 Chronic obstructive pulmonary disease with (acute) exacerbation: Secondary | ICD-10-CM

## 2015-05-13 NOTE — Patient Outreach (Signed)
[facility]) Care Management  05/13/2015  Oneita HurtJohn E Cardinal 06-Jun-1946 [id number]   Unsuccessful attempt to contact patient for scheduled assessment.  Message left requesting call back.  Tyler Deisonnie Flannery Cavallero, RN, MSN RN [employer]Health Coach [facility] [phone] Fax [phone]

## 2015-05-14 ENCOUNTER — Other Ambulatory Visit: Payer: Self-pay

## 2015-05-14 NOTE — Patient Outreach (Signed)
[facility]) Care Management  05/14/2015  Christian HurtJohn E Grimes 1945-12-30 [id number]   Unsuccessful attempt to reach patient by phone.  Message left requesting call back.  Tyler Deisonnie Beva Remund, RN, MSN RN [employer] Coach [facility] [phone] Fax [phone]

## 2015-05-20 ENCOUNTER — Other Ambulatory Visit: Payer: Self-pay

## 2015-05-20 NOTE — Patient Outreach (Signed)
[facility]) Care Management  05/20/2015  Christian HurtJohn E Mastrianni [birth year] [id number]   Unsuccessful attempt to reach patient.  HIPPA appropriate message left requesting call back.  Tyler Deisonnie Pelham Hennick, RN, MSN RN [employer] Coach [facility] [phone] Fax [phone]

## 2015-06-04 ENCOUNTER — Other Ambulatory Visit: Payer: Self-pay

## 2015-06-04 DIAGNOSIS — J441 Chronic obstructive pulmonary disease with (acute) exacerbation: Secondary | ICD-10-CM

## 2015-06-04 NOTE — Patient Outreach (Signed)
[facility]) Care Management  [facility] Care Manager  06/04/2015   Christian Grimes 1946/04/25 [id number]  Subjective: Patient reports he recently suffered a fall which required hospitalization due to a fractured leg.  He was admitted to [facility] and then transferred to [facility] for rehabilitation.  He states he returned home on 05-31-15 and is receiving home health PT and nursing from [place].  Patient states he has an appointment with PCP Dr. Elton Sin on 06-10-15.     Current Medications:  Current Outpatient Prescriptions  Medication Sig Dispense Refill  . acetaminophen (TYLENOL) 500 MG tablet Take 500 mg by mouth every 6 (six) hours as needed.    Marland Kitchen albuterol (ACCUNEB) 1.25 MG/3ML nebulizer solution Take 1 ampule by nebulization every 6 (six) hours as needed.    . ALBUTEROL SULFATE HFA IN Inhale 2 puffs into the lungs as needed.    Marland Kitchen albuterol-ipratropium (COMBIVENT) 18-103 MCG/ACT inhaler Inhale 2 puffs into the lungs every 4 (four) hours. Patient has been taking this need as needed only.    . carvedilol (COREG) 6.25 MG tablet Take 6.25 mg by mouth 2 (two) times daily with a meal.    . furosemide (LASIX) 40 MG tablet Take 160 mg by mouth 2 (two) times daily. Patient take 4 tablets 40 mg each    . ipratropium (ATROVENT) 0.02 % nebulizer solution Take 0.5 mg by nebulization 4 (four) times daily. Patient reports that he takes his med as needed only    . lisinopril (PRINIVIL,ZESTRIL) 5 MG tablet Take 5 mg by mouth daily.    Marland Kitchen loperamide (IMODIUM) 2 MG capsule Take 1 capsule by mouth as needed.    . metolazone (ZAROXOLYN) 5 MG tablet Take 5 mg by mouth as needed. Patient reports that he takes this med once or twice a week depending on weight gain.    . Tiotropium Bromide-Olodaterol 2.5-2.5 MCG/ACT AERS Inhale 2 puffs into the lungs 1 day or 1 dose. Patient was taking as needed only    . traMADol (ULTRAM) 50 MG tablet Take 50 mg by mouth 3 (three) times daily as  needed.    . warfarin (COUMADIN) 5 MG tablet Take 5 mg by mouth daily. Patient has his own INR machine and checks his own labs.    Marland Kitchen atorvastatin (LIPITOR) 20 MG tablet Take 20 mg by mouth daily.     No current facility-administered medications for this visit.    Functional Status:  In your present state of health, do you have any difficulty performing the following activities: 06/04/2015 02/27/2015  Hearing? - N  Vision? - N  Difficulty concentrating or making decisions? - N  Walking or climbing stairs? (No Data) Y  Dressing or bathing? N N  Doing errands, shopping? N Y  Quarry manager and eating ? Y N  Using the Toilet? - N  In the past six months, have you accidently leaked urine? - N  Do you have problems with loss of bowel control? - N  Managing your Medications? - N  Managing your Finances? - N  Housekeeping or managing your Housekeeping? - Y    Fall/Depression Screening: PHQ 2/9 Scores 02/27/2015 02/03/2015  PHQ - 2 Score 0 0  Exception Documentation Patient refusal -   [facility] CM Care Plan Problem One        Most Recent Value   Care Plan Problem One  Knowledge deficit related to copd as evidence by lack of understanding about  daily maintenance .  Role Documenting the Problem One  Health Coach   Care Plan for Problem One  Active   [facility]N Long Term Goal (31-90 days)  Patient will be able to report no admissions related to COPD in the next 60 days.    [facility]N Long Term Goal Start Date  05/02/15   Interventions for Problem One Long Term Goal  -- [Instructed re COPD Action Plan.]   [facility]N CM Short Term Goal #3 (0-30 days)  Patient will avoid readmission during next 30 days.   [facility] CM Short Term Goal #3 Start Date  06/04/15   Interventions for Short Tern Goal #3  -- [Referred to Transition of Care due to recent admission.]     Ass[facility]: [facility]  Plan: Patient will keep appointment with PCP on 06-10-15.           Transition of care  referral completed.  Tyler Deisonnie Sharise Lippy, RN, MSN RN [employer]Health Coach [facility] [phone] Fax [phone]

## 2015-06-06 ENCOUNTER — Other Ambulatory Visit: Payer: Self-pay

## 2015-06-06 NOTE — Patient Outreach (Signed)
Referral from telephonic. Patient reports that he fell and broke his leg. Reports no surgical intervention. States "It just has to heal"  Reports that he is able to manage at home. Continues to drive without difficulty. Patient reports that he thinks he needs to someone to stay with him. Has follow up planned for 06/10/2015 with primary MD.  Patient reports home health nurse and physical therapist has been out to see him.  Offered home visit and patient has accepted. Home visit planned for 06/10/2015.  [name], RN, BSN, CEN [facility] Care Coordinator [phone]

## 2015-06-10 ENCOUNTER — Other Ambulatory Visit: Payer: Self-pay

## 2015-06-10 VITALS — BP 124/72 | HR 65 | Resp 20 | Ht 70.0 in | Wt 270.0 lb

## 2015-06-10 DIAGNOSIS — J441 Chronic obstructive pulmonary disease with (acute) exacerbation: Secondary | ICD-10-CM

## 2015-06-10 NOTE — Addendum Note (Signed)
Addended by: Rockne MenghiniOOK, Takia Runyon U on: 06/10/2015 04:00 PM   Modules accepted: Orders

## 2015-06-10 NOTE — Patient Outreach (Addendum)
[facility]) Care Management   06/10/2015  Christian Grimes October 04, 1945 [id number]  Christian Grimes is an 69 y.o. male 10 am  Arrived for home visit. Patient reports that he forgot I was coming. Subjective: Patient reports that he broken his left fibula on 05/05/2015 as the result of 2 falls earlier that day.  Patient reports no surgery. States that he was told " its just needs to heal on its own" Patient reports that he lost his balance and fell in his carport.  Patient is currently active with [facility] for nursing and physical therapy. Patient reports that he has an appointment with Dr. Cathi Roan today for follow up on questions about his medications.  Patient reports that he wants to find someone to come live with him to help with cooking and cleaning. Feels like he needs someone with him at night.  Patient reports that he feels like his breathing is doing well since coming home from [place].   States that home health nurse is doing compression wraps to both legs, giving him his B12 injections and following since discharged home.  Reports physical therapist is working with patient twice a week.  Patient feels like he is doing well overall. States that he found a home for one of his dogs and can manage the other dog okay.  Got a new lift chair which is helping him get up and down with his left ankle.  Reports left knee pain today.  Reports that he is able to drive himself to appointments.    Objective:  Awake and alert. Breathing without difficulty. Noted a loose cough.  Lungs clear.  Able to ambulate with walker after getting up with lift chair.   Filed Vitals:   06/10/15 1007  BP: 124/72  Pulse: 65  Resp: 20  Height: 1.778 m ($Remove'5\' 10"'JyXtMVq$ )  Weight: 270 lb (122.471 kg)  SpO2: 96%   Review of Systems  Constitutional: Negative.   HENT: Negative.   Eyes: Negative.   Respiratory: Positive for cough. Negative for shortness of breath.   Gastrointestinal: Negative for nausea,  vomiting, abdominal pain, diarrhea and constipation.  Genitourinary: Negative.   Musculoskeletal: Positive for falls.       2 recent falls  Skin:       Reports legs wraps applied by home health RN yesterday.   Neurological: Negative.   Endo/Heme/Allergies: Bruises/bleeds easily.  Psychiatric/Behavioral: Negative.     Physical Exam  Constitutional: He appears well-developed and well-nourished.  Cardiovascular: Normal rate.   Respiratory: Effort normal and breath sounds normal.  Wearing oxygen at 2 liters nasal canula. Breathing without difficulty.  GI: Soft. Bowel sounds are normal.  Skin: Skin is warm and dry.  Unable to inspect feet and legs due to compression wraps.  Psychiatric: He has a normal mood and affect. His behavior is normal. Judgment and thought content normal.    Current Medications:   Current Outpatient Prescriptions  Medication Sig Dispense Refill  . acetaminophen (TYLENOL) 500 MG tablet Take 500 mg by mouth every 6 (six) hours as needed.    Marland Kitchen albuterol (ACCUNEB) 1.25 MG/3ML nebulizer solution Take 1 ampule by nebulization every 6 (six) hours as needed.    . ALBUTEROL SULFATE HFA IN Inhale 2 puffs into the lungs as needed.    Marland Kitchen albuterol-ipratropium (COMBIVENT) 18-103 MCG/ACT inhaler Inhale 2 puffs into the lungs every 4 (four) hours. Patient has been taking this need as needed only.    Marland Kitchen atorvastatin (LIPITOR) 20  MG tablet Take 20 mg by mouth daily.    . carvedilol (COREG) 6.25 MG tablet Take 6.25 mg by mouth 2 (two) times daily with a meal.    . Cyanocobalamin 1000 MCG/ML KIT Inject 1 mL as directed 2 (two) times a week. Given by home health nurse    . furosemide (LASIX) 40 MG tablet Take 160 mg by mouth 2 (two) times daily. Patient take 4 tablets 40 mg each    . ipratropium (ATROVENT) 0.02 % nebulizer solution Take 0.5 mg by nebulization 4 (four) times daily. Patient reports that he takes his med as needed only    . loperamide (IMODIUM) 2 MG capsule Take 1 capsule  by mouth as needed.    . metolazone (ZAROXOLYN) 5 MG tablet Take 5 mg by mouth as needed. Patient reports that he takes this med once or twice a week depending on weight gain.    . montelukast (SINGULAIR) 10 MG tablet Take 10 mg by mouth at bedtime.    . predniSONE (DELTASONE) 10 MG tablet Take 10 mg by mouth 2 (two) times daily.    . Tiotropium Bromide-Olodaterol 2.5-2.5 MCG/ACT AERS Inhale 2 puffs into the lungs 1 day or 1 dose. Patient was taking as needed only    . warfarin (COUMADIN) 5 MG tablet Take 5 mg by mouth daily. Patient has his own INR machine and checks his own labs.    Marland Kitchen lisinopril (PRINIVIL,ZESTRIL) 5 MG tablet Take 5 mg by mouth daily.    . traMADol (ULTRAM) 50 MG tablet Take 50 mg by mouth 3 (three) times daily as needed.     No current facility-administered medications for this visit.    Functional Status:   In your present state of health, do you have any difficulty performing the following activities: 06/10/2015 06/04/2015  Hearing? N -  Vision? N -  Difficulty concentrating or making decisions? N -  Walking or climbing stairs? Y (No Data)  Dressing or bathing? N N  Doing errands, shopping? N N  Preparing Food and eating ? N Y  Using the Toilet? N -  In the past six months, have you accidently leaked urine? Y -  Do you have problems with loss of bowel control? N -  Managing your Medications? N -  Managing your Finances? N -  Housekeeping or managing your Housekeeping? Y -    Fall/Depression Screening:    PHQ 2/9 Scores 06/10/2015 02/27/2015 02/03/2015  PHQ - 2 Score 0 0 0  Exception Documentation - Patient refusal -   Fall Risk  06/10/2015 06/04/2015 02/27/2015 02/03/2015  Falls in the past year? Yes Yes No Yes  Number falls in past yr: 2 or more 2 or more - 2 or more  Injury with Fall? Yes Yes - No  Risk Factor Category  High Fall Risk High Fall Risk - High Fall Risk  Risk for fall due to : History of fall(s);Impaired mobility;Impaired balance/gait History of  fall(s) Impaired mobility History of fall(s)  Risk for fall due to (comments): currently getting home physical therapy - - -  Follow up Falls prevention discussed Education provided - Falls prevention discussed   Assessment:   (1) falls risk noted.   (2) no recent INR.  (3) recent discharge from SNF. (4) Reports he is on the meals on wheels waiting list.  (5) Interested in caregiver assistance. Discussed [facility] benefits.  Plan:  (1) reviewed with patient the importance of observing fall risk. (2) Patient to continue to  work with home health. (3) Goal to avoid any readmission. Has a SNF follow up this afternoon. Encouraged patient to take all medications and discharge instructions with him to his appointment. (4) Placed call to [facility] to discuss where patient is on waiting list. Also sent a [facility] social work referral.  Collaborative care planning and goal setting with patient. Priority goal is avoid a readmission. Next outreach planned for November 29th.   THN CM Care Plan Problem One        Most Recent Value   Care Plan Problem One  Recent admission related to fall and fracture leg.   Role Documenting the Problem One  Care Management [place] for Problem One  Active   THN Long Term Goal (31-90 days)  Patient will be able to report no readmission in the next 31 days.   THN Long Term Goal Start Date  06/10/15   Interventions for Problem One Long Term Goal  Reviewed ways to avoid readmission. reminded patient of the importance of fall precautions and timely follow up with md.   THN CM Short Term Goal #1 (0-30 days)  Patient will  be able to verbalize understanding of medication changes. in the next 7 days.   THN CM Short Term Goal #1 Start Date  06/10/15   Interventions for Short Term Goal #1  Encouraged patient to discuss with primary MD today at his scheduled visit about his concern about not taking his lisinopril. Also discussed with patient the importance of  checking his INR prior to going to MD appointment today.   THN CM Short Term Goal #2 (0-30 days)  Patient will be able to obtain clarification of meals on wheels waiting list in the next 7 days.   THN CM Short Term Goal #2 Start Date  06/10/15   Interventions for Short Term Goal #2  Placed call to meal on wheels at the [facility]. Left a message requesting a call back.   THN CM Short Term Goal #3 (0-30 days)  Patient will be able to report no falls in the next 2 weeks.   THN CM Short Term Goal #3 Start Date  06/10/15   Interventions for Short Tern Goal #3  Discussed with patient the importance of doing home exercises, reviewed importance of using the walker at all times.       [name], RN, BSN, CEN [facility] [employer] [phone]

## 2015-06-11 ENCOUNTER — Encounter: Payer: Self-pay | Admitting: *Deleted

## 2015-06-17 ENCOUNTER — Other Ambulatory Visit: Payer: Self-pay

## 2015-06-17 NOTE — Patient Outreach (Signed)
Transition of care call: Placed call to patient who reports that he is doing well. States that is breathing is unchanged. Reports that is weight is stable. Continues to ambulate with walker. Denies any recent falls. Reports that his appointment went well with primary MD. Denies any changes.   Patient reports that he remains active with home health nurse who continue to wrap legs and give Vitamin B12 injections.  Denies any new problems or concerns. Updated patient on phone call today with Olivia Mackie from [facility]. Olivia Mackie informs me that patient was not on the waiting list and she did not have his name. I provided name and address and Olivia Mackie will reach out to patient to assess for meals on wheels eligibility.  I informed patient to expect a call.  PLAN: Patient will continue to be followed for transition of care calls weekly. THN CM Care Plan Problem One        Most Recent Value   Care Plan Problem One  Recent admission related to fall and fracture leg.   Role Documenting the Problem One  Care Management [place] for Problem One  Active   THN Long Term Goal (31-90 days)  Patient will be able to report no readmission in the next 31 days.   THN Long Term Goal Start Date  06/10/15   Interventions for Problem One Long Term Goal  Enocuraged patient to continue to weigh daily and call MD for problems.   THN CM Short Term Goal #1 (0-30 days)  Patient will  be able to verbalize understanding of medication changes. in the next 7 days.   THN CM Short Term Goal #1 Start Date  06/10/15   [facility] CM Short Term Goal #1 Met Date  06/17/15 [goal met.patient states he discussed meds with MD no changes]   Interventions for Short Term Goal #1  Encouraged patient to discuss with primary MD today at his scheduled visit about his concern about not taking his lisinopril. Also discussed with patient the importance of checking his INR prior to going to MD appointment today.   THN CM Short Term Goal #2  (0-30 days)  Patient will be able to obtain clarification of meals on wheels waiting list in the next 7 days.   THN CM Short Term Goal #2 Start Date  06/10/15   [facility] CM Short Term Goal #2 Met Date  06/17/15 [goal met. referral was made]   Interventions for Short Term Goal #2  Updated patient today about someone from [facility] will come do an assessment for starting services.    THN CM Short Term Goal #3 (0-30 days)  Patient will be able to report no falls in the next 2 weeks.   THN CM Short Term Goal #3 Start Date  06/10/15   Interventions for Short Tern Goal #3  Reminded patient to use walker at all times.      [name], RN, BSN, CEN [facility] [employer] [phone]

## 2015-06-18 ENCOUNTER — Other Ambulatory Visit: Payer: Self-pay | Admitting: *Deleted

## 2015-06-18 ENCOUNTER — Encounter: Payer: Self-pay | Admitting: *Deleted

## 2015-06-18 NOTE — Patient Outreach (Signed)
[facility]) Care Management  [facility] Social Work  06/18/2015  Christian Grimes 05/07/1946 [id number]  Subjective:    "I could use some help at home".  Objective:   CSW agreed to assist patient with trying to arrange home care services through his Aid & Attendance benefit with [employer] by applying with a representative with [facility].  Current Medications:  Current Outpatient Prescriptions  Medication Sig Dispense Refill  . acetaminophen (TYLENOL) 500 MG tablet Take 500 mg by mouth every 6 (six) hours as needed.    Marland Kitchen albuterol (ACCUNEB) 1.25 MG/3ML nebulizer solution Take 1 ampule by nebulization every 6 (six) hours as needed.    . ALBUTEROL SULFATE HFA IN Inhale 2 puffs into the lungs as needed.    Marland Kitchen albuterol-ipratropium (COMBIVENT) 18-103 MCG/ACT inhaler Inhale 2 puffs into the lungs every 4 (four) hours. Patient has been taking this need as needed only.    Marland Kitchen atorvastatin (LIPITOR) 20 MG tablet Take 20 mg by mouth daily.    . carvedilol (COREG) 6.25 MG tablet Take 6.25 mg by mouth 2 (two) times daily with a meal.    . Cyanocobalamin 1000 MCG/ML KIT Inject 1 mL as directed 2 (two) times a week. Given by home health nurse    . furosemide (LASIX) 40 MG tablet Take 160 mg by mouth 2 (two) times daily. Patient take 4 tablets 40 mg each    . ipratropium (ATROVENT) 0.02 % nebulizer solution Take 0.5 mg by nebulization 4 (four) times daily. Patient reports that he takes his med as needed only    . lisinopril (PRINIVIL,ZESTRIL) 5 MG tablet Take 5 mg by mouth daily.    Marland Kitchen loperamide (IMODIUM) 2 MG capsule Take 1 capsule by mouth as needed.    . metolazone (ZAROXOLYN) 5 MG tablet Take 5 mg by mouth as needed. Patient reports that he takes this med once or twice a week depending on weight gain.    . montelukast (SINGULAIR) 10 MG tablet Take 10 mg by mouth at bedtime.    . predniSONE (DELTASONE) 10 MG tablet Take 10 mg by mouth 2 (two) times  daily.    . Tiotropium Bromide-Olodaterol 2.5-2.5 MCG/ACT AERS Inhale 2 puffs into the lungs 1 day or 1 dose. Patient was taking as needed only    . traMADol (ULTRAM) 50 MG tablet Take 50 mg by mouth 3 (three) times daily as needed.    . warfarin (COUMADIN) 5 MG tablet Take 5 mg by mouth daily. Patient has his own INR machine and checks his own labs.     No current facility-administered medications for this visit.    Functional Status:  In your present state of health, do you have any difficulty performing the following activities: 06/18/2015 06/10/2015  Hearing? N N  Vision? N N  Difficulty concentrating or making decisions? N N  Walking or climbing stairs? Y Y  Dressing or bathing? N N  Doing errands, shopping? N N  Preparing Food and eating ? N N  Using the Toilet? N N  In the past six months, have you accidently leaked urine? Y Y  Do you have problems with loss of bowel control? N N  Managing your Medications? N N  Managing your Finances? N N  Housekeeping or managing your Housekeeping? [name]    Fall/Depression Screening:  PHQ 2/9 Scores 06/18/2015 06/10/2015 02/27/2015 02/03/2015  PHQ - 2 Score 1 0 0 0  Exception Documentation - - Patient refusal -  Assessment:   CSW was able to make initial contact with patient today to perform phone assessment, as well as assess and assist with social work needs and services.  CSW introduced self, explained role and types of services provided through [facility] ([place] [facility]).  CSW further explained to patient that CSW works with patient's RNCM, also with [place] [facility], [name]. CSW then explained the reason for the call, indicating that [name] thought that patient would benefit from social work services and resources to assist patient with trying to obtain home care services through his Aid & Attendance benefit with [employer].  CSW obtained two HIPAA compliant identifiers from  patient, which included patient's name and date of birth. After a lengthy discussion, it was determined that patient has been going to the wrong [employer] office to try and apply for his Aid & Attendance benefit.  CSW provided patient with the correct location and phone number, so that patient can call and schedule an appointment to meet with a representative with [employer].  The information provided is as follows: [address] [place], [place] [phone] Patient voiced understanding, reporting "I know where that's at, it's right across from the court house, right?  CSW was able to confirm.  Patient reported that he would call and schedule his appointment this week to try and meet with a representative to complete his Aid & Attendance application.  CSW spoke briefly about other options for home care services, but patient was not interested after learning that there would be an out-of-pocket expense.  CSW agreed to contact patient in one week to follow-up on social work services and resources, as well as check the status of his application for Aid & Attendance benefits.  Plan:   CSW will contact patient on Thursday, December 8th to ensure that patient has scheduled an appointment with a representative at the [place] to complete an application for Aid & Attendance benefits. CSW will prescribe and print EMMI information for patient to review at the initial home visit. CSW will fax a correspondence letter to patient's Primary Care physician, Dr. Urbano Heir to ensure that Dr. Cathi Roan is aware of CSW's involvement with patient's care. CSW will converse with patient's RNCM with [place] [facility], [name] to report findings of initial phone conversation with patient today.  Nat Christen, BSW, MSW, LCSW  Licensed Education officer, environmental Health  System  [address] [place] [address], [place], [place] [address] Physical Address-[address], [place], [place] [address] Toll Free Main # [phone] Fax # [phone] Cell # [phone]  Fax # [phone]  [email or web address]

## 2015-06-24 ENCOUNTER — Other Ambulatory Visit: Payer: Self-pay

## 2015-06-24 NOTE — Patient Outreach (Signed)
Transition of care call: Placed call to patient who reports that he is doing well. States home health nurse is coming tomorrow. Reports that he has not yet checked his INR but he will can notify his cardiologist.     Reports that [facility] has not yet called him.  Reports that he spoke with Education officer, museum and knows he needs to contact the [place] office.  Plan: Will continue to follow for weekly transition of care calls. Will update social worker Deitra Mayo that patient is still not set up for meals on wheels.    THN CM Care Plan Problem One        Most Recent Value   Care Plan Problem One  Recent admission related to fall and fracture leg.   Role Documenting the Problem One  Care Management [place] for Problem One  Active   THN Long Term Goal (31-90 days)  Patient will be able to report no readmission in the next 31 days.   THN Long Term Goal Start Date  06/10/15   Interventions for Problem One Long Term Goal  Reviewed need to check INF and call MD with results.No new concerns today.   THN CM Short Term Goal #1 (0-30 days)  Patient will  be able to verbalize understanding of medication changes. in the next 7 days.   THN CM Short Term Goal #1 Start Date  06/10/15   [facility] CM Short Term Goal #1 Met Date  06/17/15 [goal met.patient states he discussed meds with MD no changes]   Interventions for Short Term Goal #1  Encouraged patient to discuss with primary MD today at his scheduled visit about his concern about not taking his lisinopril. Also discussed with patient the importance of checking his INR prior to going to MD appointment today.   THN CM Short Term Goal #2 (0-30 days)  Patient will be able to obtain clarification of meals on wheels waiting list in the next 7 days.   THN CM Short Term Goal #2 Start Date  06/10/15   [facility] CM Short Term Goal #2 Met Date  06/17/15 [goal met. referral was made]   Interventions for Short Term Goal #2  Updated patient today about someone from  [facility] will come do an assessment for starting services.    THN CM Short Term Goal #3 (0-30 days)  Patient will be able to report no falls in the next 2 weeks.   THN CM Short Term Goal #3 Start Date  06/10/15   Interventions for Short Tern Goal #3  Reminded patient to use walker at all times.     [name], RN, BSN, CEN [employer] [phone]

## 2015-06-26 ENCOUNTER — Ambulatory Visit: Payer: Self-pay | Admitting: *Deleted

## 2015-07-02 ENCOUNTER — Other Ambulatory Visit: Payer: Self-pay

## 2015-07-02 ENCOUNTER — Ambulatory Visit: Payer: Self-pay

## 2015-07-02 NOTE — Patient Outreach (Signed)
Transition of care call/ case closed.  Patient discharged from SNF on 05/31/2015 after leg fracture.   Placed final transition of care call to patient today.   Patient reports that he is breathing well. States that home health [place] and physical therapy have not been recently.  Patient reports that he is not having any new problems or concerns. States that he is managing better at home alone and is considering not getting in help at home.   I placed call to [facility] and was informed that patient is no longer home bound and his case was closed on 12/5.  Alvis Lemmings states that patient is supposed to go to MD office for B12 shots. Alvis Lemmings reports that patient was instructed how to do compression wraps at home . Alvis Lemmings reports that patient was informed and instructed about future plans.  Plan:  I reviewed with patient the that social worker had suggested he contact the local [place] office and at this time patient is not sure if he will do this.  Confirmed that patient had address and phone number.  Patient also reports that he has not heard from [facility].  I will send an update to Social worker, [employer].   At this time I plan to close case and patient was informed. No nursing needs at this time. Patient  Will remain active with [facility] social worker.   THN CM Care Plan Problem One        Most Recent Value   Care Plan Problem One  Recent admission related to fall and fracture leg.   Role Documenting the Problem One  Care Management [place] for Problem One  Active   THN Long Term Goal (31-90 days)  Patient will be able to report no readmission in the next 31 days.   THN Long Term Goal Start Date  06/10/15   [facility] Long Term Goal Met Date  07/02/15 [goal met. Discharge date of 05/31/2015]   Interventions for Problem One Long Term Goal  Reviewed need to check INF and call MD with results.No new concerns today.   THN CM Short Term Goal #1 (0-30 days)  Patient will  be able to verbalize  understanding of medication changes. in the next 7 days.   THN CM Short Term Goal #1 Start Date  06/10/15   [facility] CM Short Term Goal #1 Met Date  06/17/15 [goal met.patient states he discussed meds with MD no changes]   Interventions for Short Term Goal #1  Encouraged patient to discuss with primary MD today at his scheduled visit about his concern about not taking his lisinopril. Also discussed with patient the importance of checking his INR prior to going to MD appointment today.   THN CM Short Term Goal #2 (0-30 days)  Patient will be able to obtain clarification of meals on wheels waiting list in the next 7 days.   THN CM Short Term Goal #2 Start Date  06/10/15   [facility] CM Short Term Goal #2 Met Date  06/17/15 [goal met. referral was made]   Interventions for Short Term Goal #2  Updated patient today about someone from [facility] will come do an assessment for starting services.    THN CM Short Term Goal #3 (0-30 days)  Patient will be able to report no falls in the next 2 weeks.   THN CM Short Term Goal #3 Start Date  06/10/15   THN CM Short Term Goal #3 Met  Date  07/02/15 [goal met]   Interventions for Short Tern Goal #3  Reminded patient to use walker at all times.      [name], RN, BSN, CEN [facility] [employer] [phone]

## 2015-07-15 ENCOUNTER — Other Ambulatory Visit: Payer: Self-pay | Admitting: *Deleted

## 2015-07-15 ENCOUNTER — Encounter: Payer: Self-pay | Admitting: *Deleted

## 2015-07-15 NOTE — Patient Outreach (Signed)
[facility]) Care Management  07/15/2015  Christian Grimes 19-Oct-1945 [id number]   CSW was able to make contact with patient today to follow-up regarding social work services and resources.  More specifically, CSW was able to ensure that patient is still on the waiting list for [employer] through [employer] [facility] [place].  Patient indicated that he recently received a call from a representative with [employer] reporting that he is getting closer to receiving services, but still on the waiting list.  Patient's home health services (nursing and physical therapy) with [facility] have since been terminated.  Patient was able to attend his appointment with [facility] to complete his application for Aid & Attendance Benefits through [employer]; however, patient admits that he doubts he will follow through with the application process.  Patient went on to say, "The application is quite lengthy, I doubt I will go to all that trouble".  CSW agreed to continue to assist patient with the application process, but patient was not interested.  CSW inquired as to how CSW could be of further assistance to patient at this time.  Patient denied being able to identify any additional social work specific needs at present. CSW will perform a case closure on patient, as all goals of treatment have been met from social work standpoint and no additional social work needs have been identified at this time. CSW will notify patient's RNCM with [facility] [employer], [name] of CSW's plans to close patient's case. CSW will fax a correspondence letter to patient's Primary Care Physician, Dr. Cyndy Freeze to ensure that Dr. Cathi Roan is aware of CSW's involvement with patient. CSW will submit a case closure request to Lurline Del, Care Management Assistant with [facility] [employer], in the form of an [employer].    Nat Christen, BSW, MSW, LCSW  Licensed Education officer, [facility]  [place] [address] [place], [place] [address] Physical Address-[address], [place], [place] [address] Toll Free Main # [phone] Fax # [phone] Cell # [phone]  Fax # [phone]  [email or web address]

## 2015-10-18 DEATH — deceased
